# Patient Record
Sex: Female | Born: 1965 | Race: Black or African American | Hispanic: No | Marital: Single | Smoking: Current every day smoker
Health system: Southern US, Community
[De-identification: ages and names within clinical notes are randomized; demographics above are authoritative.]

## PROBLEM LIST (undated history)

## (undated) DIAGNOSIS — I10 Essential (primary) hypertension: Secondary | ICD-10-CM

## (undated) DIAGNOSIS — G629 Polyneuropathy, unspecified: Secondary | ICD-10-CM

## (undated) DIAGNOSIS — D869 Sarcoidosis, unspecified: Secondary | ICD-10-CM

## (undated) DIAGNOSIS — E785 Hyperlipidemia, unspecified: Secondary | ICD-10-CM

## (undated) DIAGNOSIS — I639 Cerebral infarction, unspecified: Secondary | ICD-10-CM

## (undated) HISTORY — PX: SKIN SURGERY: SHX2413

## (undated) HISTORY — PX: HAND SURGERY: SHX662

## (undated) HISTORY — DX: Cerebral infarction, unspecified: I63.9

## (undated) HISTORY — DX: Hyperlipidemia, unspecified: E78.5

## (undated) HISTORY — DX: Polyneuropathy, unspecified: G62.9

## (undated) HISTORY — PX: GALLBLADDER SURGERY: SHX652

---

## 1998-03-13 HISTORY — PX: BREAST SURGERY: SHX581

## 2013-05-03 ENCOUNTER — Emergency Department: Payer: Self-pay | Admitting: Emergency Medicine

## 2013-05-03 LAB — CBC WITH DIFFERENTIAL/PLATELET
BASOS PCT: 0.3 %
Basophil #: 0 10*3/uL (ref 0.0–0.1)
Eosinophil #: 0.1 10*3/uL (ref 0.0–0.7)
Eosinophil %: 0.6 %
HCT: 38.1 % (ref 35.0–47.0)
HGB: 13.1 g/dL (ref 12.0–16.0)
Lymphocyte #: 1.7 10*3/uL (ref 1.0–3.6)
Lymphocyte %: 11.8 %
MCH: 33 pg (ref 26.0–34.0)
MCHC: 34.3 g/dL (ref 32.0–36.0)
MCV: 96 fL (ref 80–100)
MONOS PCT: 7.7 %
Monocyte #: 1.1 x10 3/mm — ABNORMAL HIGH (ref 0.2–0.9)
NEUTROS PCT: 79.6 %
Neutrophil #: 11.2 10*3/uL — ABNORMAL HIGH (ref 1.4–6.5)
Platelet: 174 10*3/uL (ref 150–440)
RBC: 3.96 10*6/uL (ref 3.80–5.20)
RDW: 12.8 % (ref 11.5–14.5)
WBC: 14.1 10*3/uL — ABNORMAL HIGH (ref 3.6–11.0)

## 2013-05-03 LAB — URINALYSIS, COMPLETE
Bilirubin,UR: NEGATIVE
Blood: NEGATIVE
Glucose,UR: NEGATIVE mg/dL (ref 0–75)
Ketone: NEGATIVE
NITRITE: NEGATIVE
PH: 6 (ref 4.5–8.0)
Protein: NEGATIVE
RBC,UR: 1 /HPF (ref 0–5)
Specific Gravity: 1.012 (ref 1.003–1.030)
Squamous Epithelial: 7
WBC UR: 1 /HPF (ref 0–5)

## 2013-05-03 LAB — COMPREHENSIVE METABOLIC PANEL
ALK PHOS: 69 U/L
AST: 26 U/L (ref 15–37)
Albumin: 3.4 g/dL (ref 3.4–5.0)
Anion Gap: 4 — ABNORMAL LOW (ref 7–16)
BILIRUBIN TOTAL: 0.6 mg/dL (ref 0.2–1.0)
BUN: 13 mg/dL (ref 7–18)
CREATININE: 0.98 mg/dL (ref 0.60–1.30)
Calcium, Total: 8.8 mg/dL (ref 8.5–10.1)
Chloride: 105 mmol/L (ref 98–107)
Co2: 25 mmol/L (ref 21–32)
EGFR (Non-African Amer.): >60
GLUCOSE: 103 mg/dL — AB (ref 65–99)
Osmolality: 269 (ref 275–301)
POTASSIUM: 4 mmol/L (ref 3.5–5.1)
SGPT (ALT): 17 U/L (ref 12–78)
SODIUM: 134 mmol/L — AB (ref 136–145)
TOTAL PROTEIN: 8.3 g/dL — AB (ref 6.4–8.2)

## 2013-05-03 LAB — LIPASE, BLOOD: Lipase: 50 U/L — ABNORMAL LOW (ref 73–393)

## 2013-07-22 ENCOUNTER — Ambulatory Visit: Payer: Self-pay | Admitting: Anesthesiology

## 2013-07-24 ENCOUNTER — Ambulatory Visit: Payer: Self-pay | Admitting: Orthopedic Surgery

## 2013-07-25 LAB — PATHOLOGY REPORT

## 2013-09-30 DIAGNOSIS — J449 Chronic obstructive pulmonary disease, unspecified: Secondary | ICD-10-CM | POA: Insufficient documentation

## 2013-10-12 ENCOUNTER — Emergency Department: Payer: Self-pay | Admitting: Emergency Medicine

## 2014-07-04 NOTE — Op Note (Signed)
PATIENT NAME:  Rebecca Yang, Rebecca Yang MR#:  [id number] DATE OF BIRTH:  [birth year]  DATE OF PROCEDURE:  07/24/2013  PREOPERATIVE DIAGNOSIS: Right palmar mass.   POSTOPERATIVE DIAGNOSIS: Right palmar mass.   PROCEDURE: Excision of mass, right palm.   ANESTHESIA: General.   SURGEON: Leitha SchullerMichael J. Shawna Wearing, MD  DESCRIPTION OF PROCEDURE: The patient was brought to the operating room, and after adequate anesthesia was obtained, the right arm was prepped and draped in the usual sterile fashion, with a tourniquet applied to the upper arm. After patient identification and timeout procedures were completed, the tourniquet was raised to 250 mmHg. A curvilinear incision was made centered over the mass and the skin elevated off of it. The mass came up to the subcutaneous tissue. It separated from the surrounding tissue and appeared to have the consistency of a ganglion. It was removed without difficulty. It did seem to have a remaining stalk that went down to the flexor tendon sheath, and this was debrided, opening up the flexor tendon sheath to try to prevent recurrence. The wound was then irrigated and closed with simple interrupted 4-0 nylon skin sutures. Then, 10 mL of 0.5% Sensorcaine without epinephrine was infiltrated proximal to the incision to aid in postoperative analgesia. Sterile dressing of Xeroform, 4 x 4, Webril and Ace wrap applied.   TOURNIQUET TIME: 7 minutes at 250 mmHg.   COMPLICATIONS: None.  SPECIMEN: Mass, right palm, presumed cyst.   ____________________________ Leitha SchullerMichael J. Emiyah Spraggins, MD mjm:lb Yang: 07/24/2013 08:44:34 ET T: 07/24/2013 09:00:00 ET JOB#: [id number]  cc: Leitha SchullerMichael J. Aleksandr Pellow, MD, <Dictator> Leitha SchullerMICHAEL J Fionna Merriott MD ELECTRONICALLY SIGNED 07/24/2013 11:04

## 2015-01-05 ENCOUNTER — Emergency Department: Payer: Medicaid Other

## 2015-01-05 ENCOUNTER — Encounter: Payer: Self-pay | Admitting: Emergency Medicine

## 2015-01-05 ENCOUNTER — Emergency Department
Admission: EM | Admit: 2015-01-05 | Discharge: 2015-01-05 | Disposition: A | Discharge status: Home / Self Care | Payer: Medicaid Other | Attending: Emergency Medicine | Admitting: Emergency Medicine

## 2015-01-05 DIAGNOSIS — S0990XA Unspecified injury of head, initial encounter: Secondary | ICD-10-CM | POA: Insufficient documentation

## 2015-01-05 DIAGNOSIS — S161XXA Strain of muscle, fascia and tendon at neck level, initial encounter: Secondary | ICD-10-CM

## 2015-01-05 DIAGNOSIS — I10 Essential (primary) hypertension: Secondary | ICD-10-CM | POA: Diagnosis not present

## 2015-01-05 DIAGNOSIS — Y9389 Activity, other specified: Secondary | ICD-10-CM | POA: Diagnosis not present

## 2015-01-05 DIAGNOSIS — Z880 Allergy status to penicillin: Secondary | ICD-10-CM | POA: Insufficient documentation

## 2015-01-05 DIAGNOSIS — S0081XA Abrasion of other part of head, initial encounter: Secondary | ICD-10-CM | POA: Diagnosis not present

## 2015-01-05 DIAGNOSIS — Y998 Other external cause status: Secondary | ICD-10-CM | POA: Diagnosis not present

## 2015-01-05 DIAGNOSIS — S0083XA Contusion of other part of head, initial encounter: Secondary | ICD-10-CM

## 2015-01-05 DIAGNOSIS — Z720 Tobacco use: Secondary | ICD-10-CM | POA: Insufficient documentation

## 2015-01-05 DIAGNOSIS — S199XXA Unspecified injury of neck, initial encounter: Secondary | ICD-10-CM | POA: Diagnosis present

## 2015-01-05 DIAGNOSIS — Y92410 Unspecified street and highway as the place of occurrence of the external cause: Secondary | ICD-10-CM | POA: Insufficient documentation

## 2015-01-05 DIAGNOSIS — S299XXA Unspecified injury of thorax, initial encounter: Secondary | ICD-10-CM | POA: Diagnosis not present

## 2015-01-05 HISTORY — DX: Essential (primary) hypertension: I10

## 2015-01-05 MED ORDER — IBUPROFEN 800 MG PO TABS: 800.0000 mg | ORAL_TABLET | Freq: Three times a day (TID) | ORAL | Status: DC | PRN

## 2015-01-05 MED ORDER — DIAZEPAM 5 MG PO TABS: 5.0000 mg | ORAL_TABLET | Freq: Three times a day (TID) | ORAL | Status: DC | PRN

## 2015-01-05 MED ORDER — OXYCODONE-ACETAMINOPHEN 5-325 MG PO TABS
2.0000 | ORAL_TABLET | Freq: Once | ORAL | Status: AC
  Administered 2015-01-05: 2 via ORAL
  Filled 2015-01-05: qty 2
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
## 2015-01-05 NOTE — Discharge Instructions (Signed)
Cervical Strain and Sprain With Rehab °Cervical strain and sprain are injuries that commonly occur with "whiplash" injuries. Whiplash occurs when the neck is forcefully whipped backward or forward, such as during a motor vehicle accident or during contact sports. The muscles, ligaments, tendons, discs, and nerves of the neck are susceptible to injury when this occurs. °RISK FACTORS °Risk of having a whiplash injury increases if: °· Osteoarthritis of the spine. °· Situations that make head or neck accidents or trauma more likely. °· High-risk sports (football, rugby, wrestling, hockey, auto racing, gymnastics, diving, contact karate, or boxing). °· Poor strength and flexibility of the neck. °· Previous neck injury. °· Poor tackling technique. °· Improperly fitted or padded equipment. °SYMPTOMS  °· Pain or stiffness in the front or back of neck or both. °· Symptoms may present immediately or up to 24 hours after injury. °· Dizziness, headache, nausea, and vomiting. °· Muscle spasm with soreness and stiffness in the neck. °· Tenderness and swelling at the injury site. °PREVENTION °· Learn and use proper technique (avoid tackling with the head, spearing, and head-butting; use proper falling techniques to avoid landing on the head). °· Warm up and stretch properly before activity. °· Maintain physical fitness: °· Strength, flexibility, and endurance. °· Cardiovascular fitness. °· Wear properly fitted and padded protective equipment, such as padded soft collars, for participation in contact sports. °PROGNOSIS  °Recovery from cervical strain and sprain injuries is dependent on the extent of the injury. These injuries are usually curable in 1 week to 3 months with appropriate treatment.  °RELATED COMPLICATIONS  °· Temporary numbness and weakness may occur if the nerve roots are damaged, and this may persist until the nerve has completely healed. °· Chronic pain due to frequent recurrence of symptoms. °· Prolonged healing,  especially if activity is resumed too soon (before complete recovery). °TREATMENT  °Treatment initially involves the use of ice and medication to help reduce pain and inflammation. It is also important to perform strengthening and stretching exercises and modify activities that worsen symptoms so the injury does not get worse. These exercises may be performed at home or with a therapist. For patients who experience severe symptoms, a soft, padded collar may be recommended to be worn around the neck.  °Improving your posture may help reduce symptoms. Posture improvement includes pulling your chin and abdomen in while sitting or standing. If you are sitting, sit in a firm chair with your buttocks against the back of the chair. While sleeping, try replacing your pillow with a small towel rolled to 2 inches in diameter, or use a cervical pillow or soft cervical collar. Poor sleeping positions delay healing.  °For patients with nerve root damage, which causes numbness or weakness, the use of a cervical traction apparatus may be recommended. Surgery is rarely necessary for these injuries. However, cervical strain and sprains that are present at birth (congenital) may require surgery. °MEDICATION  °· If pain medication is necessary, nonsteroidal anti-inflammatory medications, such as aspirin and ibuprofen, or other minor pain relievers, such as acetaminophen, are often recommended. °· Do not take pain medication for 7 days before surgery. °· Prescription pain relievers may be given if deemed necessary by your caregiver. Use only as directed and only as much as you need. °HEAT AND COLD:  °· Cold treatment (icing) relieves pain and reduces inflammation. Cold treatment should be applied for 10 to 15 minutes every 2 to 3 hours for inflammation and pain and immediately after any activity that aggravates your   symptoms. Use ice packs or an ice massage. °· Heat treatment may be used prior to performing the stretching and  strengthening activities prescribed by your caregiver, physical therapist, or athletic trainer. Use a heat pack or a warm soak. °SEEK MEDICAL CARE IF:  °· Symptoms get worse or do not improve in 2 weeks despite treatment. °· New, unexplained symptoms develop (drugs used in treatment may produce side effects). °EXERCISES °RANGE OF MOTION (ROM) AND STRETCHING EXERCISES - Cervical Strain and Sprain °These exercises may help you when beginning to rehabilitate your injury. In order to successfully resolve your symptoms, you must improve your posture. These exercises are designed to help reduce the forward-head and rounded-shoulder posture which contributes to this condition. Your symptoms may resolve with or without further involvement from your physician, physical therapist or athletic trainer. While completing these exercises, remember:  °· Restoring tissue flexibility helps normal motion to return to the joints. This allows healthier, less painful movement and activity. °· An effective stretch should be held for at least 20 seconds, although you may need to begin with shorter hold times for comfort. °· A stretch should never be painful. You should only feel a gentle lengthening or release in the stretched tissue. °STRETCH- Axial Extensors °· Lie on your back on the floor. You may bend your knees for comfort. Place a rolled-up hand towel or dish towel, about 2 inches in diameter, under the part of your head that makes contact with the floor. °· Gently tuck your chin, as if trying to make a "double chin," until you feel a gentle stretch at the base of your head. °· Hold __________ seconds. °Repeat __________ times. Complete this exercise __________ times per day.  °STRETCH - Axial Extension  °· Stand or sit on a firm surface. Assume a good posture: chest up, shoulders drawn back, abdominal muscles slightly tense, knees unlocked (if standing) and feet hip width apart. °· Slowly retract your chin so your head slides back  and your chin slightly lowers. Continue to look straight ahead. °· You should feel a gentle stretch in the back of your head. Be certain not to feel an aggressive stretch since this can cause headaches later. °· Hold for __________ seconds. °Repeat __________ times. Complete this exercise __________ times per day. °STRETCH - Cervical Side Bend  °· Stand or sit on a firm surface. Assume a good posture: chest up, shoulders drawn back, abdominal muscles slightly tense, knees unlocked (if standing) and feet hip width apart. °· Without letting your nose or shoulders move, slowly tip your right / left ear to your shoulder until your feel a gentle stretch in the muscles on the opposite side of your neck. °· Hold __________ seconds. °Repeat __________ times. Complete this exercise __________ times per day. °STRETCH - Cervical Rotators  °· Stand or sit on a firm surface. Assume a good posture: chest up, shoulders drawn back, abdominal muscles slightly tense, knees unlocked (if standing) and feet hip width apart. °· Keeping your eyes level with the ground, slowly turn your head until you feel a gentle stretch along the back and opposite side of your neck. °· Hold __________ seconds. °Repeat __________ times. Complete this exercise __________ times per day. °RANGE OF MOTION - Neck Circles  °· Stand or sit on a firm surface. Assume a good posture: chest up, shoulders drawn back, abdominal muscles slightly tense, knees unlocked (if standing) and feet hip width apart. °· Gently roll your head down and around from the back   of one shoulder to the back of the other. The motion should never be forced or painful. °· Repeat the motion 10-20 times, or until you feel the neck muscles relax and loosen. °Repeat __________ times. Complete the exercise __________ times per day. °STRENGTHENING EXERCISES - Cervical Strain and Sprain °These exercises may help you when beginning to rehabilitate your injury. They may resolve your symptoms with or  without further involvement from your physician, physical therapist, or athletic trainer. While completing these exercises, remember:  °· Muscles can gain both the endurance and the strength needed for everyday activities through controlled exercises. °· Complete these exercises as instructed by your physician, physical therapist, or athletic trainer. Progress the resistance and repetitions only as guided. °· You may experience muscle soreness or fatigue, but the pain or discomfort you are trying to eliminate should never worsen during these exercises. If this pain does worsen, stop and make certain you are following the directions exactly. If the pain is still present after adjustments, discontinue the exercise until you can discuss the trouble with your clinician. °STRENGTH - Cervical Flexors, Isometric °· Face a wall, standing about 6 inches away. Place a small pillow, a ball about 6-8 inches in diameter, or a folded towel between your forehead and the wall. °· Slightly tuck your chin and gently push your forehead into the soft object. Push only with mild to moderate intensity, building up tension gradually. Keep your jaw and forehead relaxed. °· Hold 10 to 20 seconds. Keep your breathing relaxed. °· Release the tension slowly. Relax your neck muscles completely before you start the next repetition. °Repeat __________ times. Complete this exercise __________ times per day. °STRENGTH- Cervical Lateral Flexors, Isometric  °· Stand about 6 inches away from a wall. Place a small pillow, a ball about 6-8 inches in diameter, or a folded towel between the side of your head and the wall. °· Slightly tuck your chin and gently tilt your head into the soft object. Push only with mild to moderate intensity, building up tension gradually. Keep your jaw and forehead relaxed. °· Hold 10 to 20 seconds. Keep your breathing relaxed. °· Release the tension slowly. Relax your neck muscles completely before you start the next  repetition. °Repeat __________ times. Complete this exercise __________ times per day. °STRENGTH - Cervical Extensors, Isometric  °· Stand about 6 inches away from a wall. Place a small pillow, a ball about 6-8 inches in diameter, or a folded towel between the back of your head and the wall. °· Slightly tuck your chin and gently tilt your head back into the soft object. Push only with mild to moderate intensity, building up tension gradually. Keep your jaw and forehead relaxed. °· Hold 10 to 20 seconds. Keep your breathing relaxed. °· Release the tension slowly. Relax your neck muscles completely before you start the next repetition. °Repeat __________ times. Complete this exercise __________ times per day. °POSTURE AND BODY MECHANICS CONSIDERATIONS - Cervical Strain and Sprain °Keeping correct posture when sitting, standing or completing your activities will reduce the stress put on different body tissues, allowing injured tissues a chance to heal and limiting painful experiences. The following are general guidelines for improved posture. Your physician or physical therapist will provide you with any instructions specific to your needs. While reading these guidelines, remember: °· The exercises prescribed by your provider will help you have the flexibility and strength to maintain correct postures. °· The correct posture provides the optimal environment for your joints to work.   All of your joints have less wear and tear when properly supported by a spine with good posture. This means you will experience a healthier, less painful body. °· Correct posture must be practiced with all of your activities, especially prolonged sitting and standing. Correct posture is as important when doing repetitive low-stress activities (typing) as it is when doing a single heavy-load activity (lifting). °PROLONGED STANDING WHILE SLIGHTLY LEANING FORWARD °When completing a task that requires you to lean forward while standing in one  place for a long time, place either foot up on a stationary 2- to 4-inch high object to help maintain the best posture. When both feet are on the ground, the low back tends to lose its slight inward curve. If this curve flattens (or becomes too large), then the back and your other joints will experience too much stress, fatigue more quickly, and can cause pain.  °RESTING POSITIONS °Consider which positions are most painful for you when choosing a resting position. If you have pain with flexion-based activities (sitting, bending, stooping, squatting), choose a position that allows you to rest in a less flexed posture. You would want to avoid curling into a fetal position on your side. If your pain worsens with extension-based activities (prolonged standing, working overhead), avoid resting in an extended position such as sleeping on your stomach. Most people will find more comfort when they rest with their spine in a more neutral position, neither too rounded nor too arched. Lying on a non-sagging bed on your side with a pillow between your knees, or on your back with a pillow under your knees will often provide some relief. Keep in mind, being in any one position for a prolonged period of time, no matter how correct your posture, can still lead to stiffness. °WALKING °Walk with an upright posture. Your ears, shoulders, and hips should all line up. °OFFICE WORK °When working at a desk, create an environment that supports good, upright posture. Without extra support, muscles fatigue and lead to excessive strain on joints and other tissues. °CHAIR: °· A chair should be able to slide under your desk when your back makes contact with the back of the chair. This allows you to work closely. °· The chair's height should allow your eyes to be level with the upper part of your monitor and your hands to be slightly lower than your elbows. °· Body position: °¨ Your feet should make contact with the floor. If this is not  possible, use a foot rest. °¨ Keep your ears over your shoulders. This will reduce stress on your neck and low back. °  °This information is not intended to replace advice given to you by your health care provider. Make sure you discuss any questions you have with your health care provider. °  °Document Released: 02/27/2005 Document Revised: 03/20/2014 Document Reviewed: 06/11/2008 °Elsevier Interactive Patient Education ©2016 [employer] ° °Motor Vehicle Collision °After a car crash (motor vehicle collision), it is normal to have bruises and sore muscles. The first 24 hours usually feel the worst. After that, you will likely start to feel better each day. °HOME CARE °· Put ice on the injured area. °¨ Put ice in a plastic bag. °¨ Place a towel between your skin and the bag. °¨ Leave the ice on for 15-20 minutes, 03-04 times a day. °· Drink enough fluids to keep your pee (urine) clear or pale yellow. °· Do not drink alcohol. °· Take a warm shower or bath 1 or   2 times a day. This helps your sore muscles. °· Return to activities as told by your doctor. Be careful when lifting. Lifting can make neck or back pain worse. °· Only take medicine as told by your doctor. Do not use aspirin. °GET HELP RIGHT AWAY IF:  °· Your arms or legs tingle, feel weak, or lose feeling (numbness). °· You have headaches that do not get better with medicine. °· You have neck pain, especially in the middle of the back of your neck. °· You cannot control when you pee (urinate) or poop (bowel movement). °· Pain is getting worse in any part of your body. °· You are short of breath, dizzy, or pass out (faint). °· You have chest pain. °· You feel sick to your stomach (nauseous), throw up (vomit), or sweat. °· You have belly (abdominal) pain that gets worse. °· There is blood in your pee, poop, or throw up. °· You have pain in your shoulder (shoulder strap areas). °· Your problems are getting worse. °MAKE SURE YOU:  °· Understand these  instructions. °· Will watch your condition. °· Will get help right away if you are not doing well or get worse. °  °This information is not intended to replace advice given to you by your health care provider. Make sure you discuss any questions you have with your health care provider. °  °Document Released: 08/16/2007 Document Revised: 05/22/2011 Document Reviewed: 07/27/2010 °Elsevier Interactive Patient Education ©2016 [employer] ° °
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
## 2017-01-22 ENCOUNTER — Emergency Department
Admission: EM | Admit: 2017-01-22 | Discharge: 2017-01-22 | Disposition: A | Discharge status: Home / Self Care | Payer: Self-pay | Attending: Emergency Medicine | Admitting: Emergency Medicine

## 2017-01-22 ENCOUNTER — Encounter: Payer: Self-pay | Admitting: Emergency Medicine

## 2017-01-22 ENCOUNTER — Emergency Department: Payer: Self-pay

## 2017-01-22 DIAGNOSIS — F17200 Nicotine dependence, unspecified, uncomplicated: Secondary | ICD-10-CM | POA: Insufficient documentation

## 2017-01-22 DIAGNOSIS — B9789 Other viral agents as the cause of diseases classified elsewhere: Secondary | ICD-10-CM

## 2017-01-22 DIAGNOSIS — J069 Acute upper respiratory infection, unspecified: Secondary | ICD-10-CM | POA: Insufficient documentation

## 2017-01-22 DIAGNOSIS — I10 Essential (primary) hypertension: Secondary | ICD-10-CM | POA: Insufficient documentation

## 2017-01-22 HISTORY — DX: Sarcoidosis, unspecified: D86.9

## 2017-01-22 MED ORDER — IPRATROPIUM-ALBUTEROL 0.5-2.5 (3) MG/3ML IN SOLN
RESPIRATORY_TRACT | Status: AC
  Filled 2017-01-22: qty 3

## 2017-01-22 MED ORDER — GUAIFENESIN-CODEINE 100-10 MG/5ML PO SOLN: 5.0000 mL | ORAL | 0 refills | Status: DC | PRN

## 2017-01-22 MED ORDER — IPRATROPIUM-ALBUTEROL 0.5-2.5 (3) MG/3ML IN SOLN
3.0000 mL | Freq: Once | RESPIRATORY_TRACT | Status: AC
  Administered 2017-01-22: 3 mL via RESPIRATORY_TRACT
  Filled 2017-01-22: qty 3

## 2017-01-22 NOTE — Discharge Instructions (Signed)
Follow-[facility] primary care provider or [facility] if any continued problems. Increase fluids. Tylenol or ibuprofen as needed for body aches or fever. You have a prescription for Robitussin with codeine. Take this medication only as directed. Do not drive or operate machinery while taking this medication due to drowsiness.

## 2017-01-22 NOTE — ED Provider Notes (Signed)
[facility] Emergency Department Provider Note   ____________________________________________   First MD Initiated Contact with Patient 01/22/17 1626     (approximate)  I have reviewed the triage vital signs and the nursing notes.   HISTORY  Chief Complaint Cough; Generalized Body Aches; and Sore Throat  HPI Rebecca Yang is a 51 y.o. female complaint sore throat for 5-6 days with a productive cough. Patient complains of congestion and body aches. Patient has hoarseness when she speaks. Patient continues to eat and drink as normal. She has been taking some over-the-counter medication without any relief.patient states that she continues to cough at night preventing her from sleeping. She rates her pain as 7 out of 10.   Past Medical History:  Diagnosis Date  . Hypertension   . Sarcoidosis     There are no active problems to display for this patient.   Past Surgical History:  Procedure Laterality Date  . BREAST SURGERY  2000   lumpectomy  . HAND SURGERY    . SKIN SURGERY      Prior to Admission medications   Medication Sig Start Date End Date Taking? Authorizing Provider  guaiFENesin-codeine 100-10 MG/5ML syrup Take 5 mLs every 4 (four) hours as needed by mouth for cough. 01/22/17   Tommi RumpsSummers, Rhonda L, PA-C    Allergies Penicillins  No family history on file.  Social History Social History   Tobacco Use  . Smoking status: Current Every Day Smoker    Packs/day: 0.20  . Smokeless tobacco: Never Used  Substance Use Topics  . Alcohol use: Yes    Comment: occas  . Drug use: Not on file    Review of Systems Constitutional: No fever/chills Eyes: No visual changes. ENT: positive sore throat. Cardiovascular: Denies chest pain. Respiratory: Denies shortness of breath. Positive productive cough. Gastrointestinal:   No nausea, no vomiting.   Musculoskeletal: Negative for back pain. Skin: Negative for rash. Neurological: Negative for  headaches, focal weakness or numbness. ____________________________________________   PHYSICAL EXAM:  VITAL SIGNS: ED Triage Vitals  Enc Vitals Group     BP 01/22/17 1543 122/87     Pulse Rate 01/22/17 1543 94     Resp 01/22/17 1543 18     Temp 01/22/17 1543 98.4 F (36.9 C)     Temp Source 01/22/17 1543 Oral     SpO2 01/22/17 1543 98 %     Weight 01/22/17 1544 127 lb (57.6 kg)     Height 01/22/17 1544 5\' 4"  (1.626 m)     Head Circumference --      Peak Flow --      Pain Score 01/22/17 1543 7     Pain Loc --      Pain Edu? --      Excl. in GC? --    Constitutional: Alert and oriented. Well appearing and in no acute distress. Eyes: Conjunctivae are normal.  Head: Atraumatic. Nose:mild congestion/rhinnorhea.  EACs are clear. TMs are dull bilaterally. Mouth/Throat: Mucous membranes are moist.  Oropharynx non-erythematous. Moderate posterior drainage seen. Neck: No stridor.   Hematological/Lymphatic/Immunilogical: No cervical lymphadenopathy. Cardiovascular: Normal rate, regular rhythm. Grossly normal heart sounds.  Good peripheral circulation. Respiratory: Normal respiratory effort.  No retractions. Lungs no wheezes no rales are noted. Patient has congested cough. Musculoskeletal: moves upper and lower extremities without any difficulty. Normal gait was noted. Neurologic:  Normal speech and language. No gross focal neurologic deficits are appreciated.  Skin:  Skin is warm, dry and intact.  No rash noted. Psychiatric: Mood and affect are normal. Speech and behavior are normal.  ____________________________________________   LABS (all labs ordered are listed, but only abnormal results are displayed)  Labs Reviewed - No data to display RADIOLOGY  Dg Chest 2 View  Result Date: 01/22/2017 CLINICAL DATA:  Patient reports having a sore throat since Wednesday and productive cough/congestion with yellow phlegm with body aches. EXAM: CHEST  2 VIEW COMPARISON:  None. FINDINGS:  Cardiac silhouette is normal in size and configuration. No mediastinal or hilar masses. No convincing adenopathy. Pulmonary anastomosis staples are noted in the left lung base. There are linear and coarse reticular opacities in the lungs most evident in the upper lobes, consistent with scarring. There is no lung consolidation to suggest pneumonia. There is no evidence of pulmonary edema. No pleural effusion or pneumothorax. Skeletal structures are intact. IMPRESSION: 1. No acute cardiopulmonary disease. 2. Changes from prior left lower lung surgery. Bilateral, predominantly upper lobe, scarring. Electronically Signed   By: Amie Portlandavid  Ormond M.D.   On: 01/22/2017 18:25    ____________________________________________   PROCEDURES  Procedure(s) performed: None  Procedures  Critical Care performed: No  ____________________________________________   INITIAL IMPRESSION / ASSESSMENT AND PLAN / ED COURSE Patient was made aware that her chest x-ray did not show any signs of pneumonia. She was given information about viral upper respiratory infections. She is to increase fluids. Take Tylenol or ibuprofen as needed for body aches. She is also given a prescription for Robitussin with codeine to take as needed for cough. She is aware she cannot drive while taking this medication.  Patient was also given a nebulizer treatment while in the department which she states made little improvement of her cough.  ____________________________________________   FINAL CLINICAL IMPRESSION(S) / ED DIAGNOSES  Final diagnoses:  Viral URI with cough     ED Discharge Orders        Ordered    guaiFENesin-codeine 100-10 MG/5ML syrup  Every 4 hours PRN     01/22/17 1839       Note:  This document was prepared using Dragon voice recognition software and may include unintentional dictation errors.    Tommi RumpsSummers, Rhonda L, PA-C 01/22/17 1844    Tommi RumpsSummers, Rhonda L, PA-C 01/22/17 1950    Tommi RumpsSummers, Rhonda L,  PA-C 01/22/17 2055    Nita SickleVeronese, Pettus, MD 01/25/17 [id number]

## 2017-01-22 NOTE — ED Triage Notes (Signed)
Patient presents to the ED with sore throat since Wednesday and productive cough/congestion with yellow phlegm with body aches.  Patient's voice is hoarse but patient is maintaining secretions and speaking in full sentences.

## 2017-01-22 NOTE — ED Notes (Addendum)
Pt reports a cough.  Coughing up yellow phlegm.  No fever . cig smoker.  No chest pain.  Hx sarcoids.   Sx since last week.    Pt alert.

## 2020-07-08 ENCOUNTER — Inpatient Hospital Stay
Admission: EM | Admit: 2020-07-08 | Discharge: 2020-07-11 | DRG: 064 | Disposition: A | Discharge status: Home / Self Care | Payer: Self-pay | Attending: Internal Medicine | Admitting: Internal Medicine

## 2020-07-08 ENCOUNTER — Emergency Department: Payer: Self-pay

## 2020-07-08 DIAGNOSIS — I10 Essential (primary) hypertension: Secondary | ICD-10-CM | POA: Diagnosis present

## 2020-07-08 DIAGNOSIS — Z20822 Contact with and (suspected) exposure to covid-19: Secondary | ICD-10-CM | POA: Diagnosis present

## 2020-07-08 DIAGNOSIS — G911 Obstructive hydrocephalus: Secondary | ICD-10-CM | POA: Diagnosis present

## 2020-07-08 DIAGNOSIS — G8191 Hemiplegia, unspecified affecting right dominant side: Secondary | ICD-10-CM | POA: Diagnosis present

## 2020-07-08 DIAGNOSIS — G939 Disorder of brain, unspecified: Secondary | ICD-10-CM

## 2020-07-08 DIAGNOSIS — I619 Nontraumatic intracerebral hemorrhage, unspecified: Secondary | ICD-10-CM | POA: Diagnosis present

## 2020-07-08 DIAGNOSIS — Z880 Allergy status to penicillin: Secondary | ICD-10-CM

## 2020-07-08 DIAGNOSIS — I618 Other nontraumatic intracerebral hemorrhage: Principal | ICD-10-CM | POA: Diagnosis present

## 2020-07-08 DIAGNOSIS — F17210 Nicotine dependence, cigarettes, uncomplicated: Secondary | ICD-10-CM | POA: Diagnosis present

## 2020-07-08 DIAGNOSIS — R29701 NIHSS score 1: Secondary | ICD-10-CM | POA: Diagnosis present

## 2020-07-08 DIAGNOSIS — R900 Intracranial space-occupying lesion found on diagnostic imaging of central nervous system: Secondary | ICD-10-CM | POA: Diagnosis present

## 2020-07-08 DIAGNOSIS — E785 Hyperlipidemia, unspecified: Secondary | ICD-10-CM | POA: Diagnosis present

## 2020-07-08 DIAGNOSIS — G935 Compression of brain: Secondary | ICD-10-CM | POA: Diagnosis present

## 2020-07-08 DIAGNOSIS — D869 Sarcoidosis, unspecified: Secondary | ICD-10-CM | POA: Diagnosis present

## 2020-07-08 DIAGNOSIS — G9389 Other specified disorders of brain: Secondary | ICD-10-CM | POA: Diagnosis present

## 2020-07-08 LAB — COMPREHENSIVE METABOLIC PANEL
ALT: 12 U/L (ref 0–44)
AST: 19 U/L (ref 15–41)
Albumin: 4.2 g/dL (ref 3.5–5.0)
Alkaline Phosphatase: 67 U/L (ref 38–126)
Anion gap: 10 (ref 5–15)
BUN: 20 mg/dL (ref 6–20)
CO2: 26 mmol/L (ref 22–32)
Calcium: 9.4 mg/dL (ref 8.9–10.3)
Chloride: 101 mmol/L (ref 98–111)
Creatinine, Ser: 1.14 mg/dL — ABNORMAL HIGH (ref 0.44–1.00)
GFR, Estimated: 56 mL/min — ABNORMAL LOW (ref >60)
Glucose, Bld: 136 mg/dL — ABNORMAL HIGH (ref 70–99)
Potassium: 3.1 mmol/L — ABNORMAL LOW (ref 3.5–5.1)
Sodium: 137 mmol/L (ref 135–145)
Total Bilirubin: 0.8 mg/dL (ref 0.3–1.2)
Total Protein: 8.3 g/dL — ABNORMAL HIGH (ref 6.5–8.1)

## 2020-07-08 LAB — URINALYSIS, COMPLETE (UACMP) WITH MICROSCOPIC
Bacteria, UA: NONE SEEN
Bilirubin Urine: NEGATIVE
Glucose, UA: NEGATIVE mg/dL
Hgb urine dipstick: NEGATIVE
Ketones, ur: NEGATIVE mg/dL
Leukocytes,Ua: NEGATIVE
Nitrite: NEGATIVE
Protein, ur: NEGATIVE mg/dL
Specific Gravity, Urine: 1.014 (ref 1.005–1.030)
Squamous Epithelial / LPF: NONE SEEN (ref 0–5)
pH: 5 (ref 5.0–8.0)

## 2020-07-08 LAB — CBC
HCT: 39.1 % (ref 36.0–46.0)
Hemoglobin: 13.6 g/dL (ref 12.0–15.0)
MCH: 33.7 pg (ref 26.0–34.0)
MCHC: 34.8 g/dL (ref 30.0–36.0)
MCV: 96.8 fL (ref 80.0–100.0)
Platelets: 195 10*3/uL (ref 150–400)
RBC: 4.04 MIL/uL (ref 3.87–5.11)
RDW: 12.2 % (ref 11.5–15.5)
WBC: 8.1 10*3/uL (ref 4.0–10.5)
nRBC: 0 % (ref 0.0–0.2)

## 2020-07-08 MED ORDER — KETOROLAC TROMETHAMINE 30 MG/ML IJ SOLN
30.0000 mg | Freq: Once | INTRAMUSCULAR | Status: AC
  Administered 2020-07-08: 30 mg via INTRAVENOUS
  Filled 2020-07-08: qty 1

## 2020-07-08 MED ORDER — GADOBUTROL 1 MMOL/ML IV SOLN
6.0000 mL | Freq: Once | INTRAVENOUS | Status: AC | PRN
  Administered 2020-07-08: 6 mL via INTRAVENOUS

## 2020-07-08 MED ORDER — DIPHENHYDRAMINE HCL 50 MG/ML IJ SOLN
50.0000 mg | Freq: Once | INTRAMUSCULAR | Status: AC
  Administered 2020-07-08: 50 mg via INTRAVENOUS
  Filled 2020-07-08: qty 1

## 2020-07-08 MED ORDER — SODIUM CHLORIDE 0.9 % IV BOLUS
1000.0000 mL | Freq: Once | INTRAVENOUS | Status: AC
  Administered 2020-07-08: 1000 mL via INTRAVENOUS

## 2020-07-08 MED ORDER — METOCLOPRAMIDE HCL 5 MG/ML IJ SOLN
10.0000 mg | Freq: Once | INTRAMUSCULAR | Status: AC
  Administered 2020-07-08: 10 mg via INTRAVENOUS
  Filled 2020-07-08: qty 2

## 2020-07-08 NOTE — H&P (Signed)
History and Physical   [facility] [place] [facility] Admission History and Physical [employer], D.O.    Patient Name: Rebecca Yang MR#: [id number] Date of Birth: May 06, 1965 Date of Admission: 07/08/2020  Referring MD/NP/PA: Dr. Lenard Lance Primary Care Physician: Patient, No Pcp Per (Inactive)  Chief Complaint:  Chief Complaint  Patient presents with  . Headache    Since Sunday, nausea, no vomiting     HPI: Rebecca Yang is a 55 y.o. female with a known history of HTN, sarcoidosis presents to the emergency department for evaluation of headache.  Patient was in a usual state of health until four days ago when she developed intractable headache, hasn't had one in over 20 years.  Symptoms are associated with  RUE weakness associated with phono and photophobia, nausea.  States that her speech and vision are unaffected but she didn't feel like talking due to severe headache.  She thought her HA was related to high blood pressure because she has had a poor diet recently but hasn't checked her BP at  Home.       Patient denies fevers/chills, weakness, dizziness, chest pain, shortness of breath, N/V/C/D, abdominal pain, dysuria/frequency, changes in mental status.    Otherwise there has been no change in status. Patient has been taking medication as prescribed and there has been no recent change in medication or diet.  No recent antibiotics.  There has been no recent illness, hospitalizations, travel or sick contacts.    EMS/ED Course: Patient received Medrol, Toradol, Reglan and normal saline. Medical admission has been requested for further management of intracranial mass versus hemorrhagic infarct.  Emergency department physician discussed the case with neurosurgery who requested inpatient admission for neurology consult.  Surgical intervention required at this time will likely require repeat MRI in 6 weeks for follow-up.  Review of Systems:  CONSTITUTIONAL: No  fever/chills, fatigue, weakness, weight gain/loss, EYES: No blurry or double vision. ENT: No tinnitus, postnasal drip, redness or soreness of the oropharynx. RESPIRATORY: No cough, dyspnea, wheeze.  No hemoptysis.  CARDIOVASCULAR: No chest pain, palpitations, syncope, orthopnea. No lower extremity edema.  GASTROINTESTINAL: Positive nausea, negative vomiting, abdominal pain, diarrhea, constipation.  No hematemesis, melena or hematochezia. GENITOURINARY: No dysuria, frequency, hematuria. ENDOCRINE: No polyuria or nocturia. No heat or cold intolerance. HEMATOLOGY: No anemia, bruising, bleeding. INTEGUMENTARY: No rashes, ulcers, lesions. MUSCULOSKELETAL: No arthritis, gout, dyspnea. NEUROLOGIC: Positive headache. RUE weakness No numbness, tingling, ataxia, seizure-type activity, PSYCHIATRIC: No anxiety, depression, insomnia.   Past Medical History:  Diagnosis Date  . Hypertension   . Sarcoidosis     Past Surgical History:  Procedure Laterality Date  . BREAST SURGERY  2000   lumpectomy  . HAND SURGERY    . SKIN SURGERY       reports that she has been smoking. She has been smoking about 0.20 packs per day. She has never used smokeless tobacco. She reports current alcohol use. No history on file for drug use.  Allergies  Allergen Reactions  . Penicillins Hives    History reviewed. No pertinent family history.  Prior to Admission medications   Medication Sig Start Date End Date Taking? Authorizing Provider  guaiFENesin-codeine 100-10 MG/5ML syrup Take 5 mLs every 4 (four) hours as needed by mouth for cough. 01/22/17   Tommi Rumps, PA-C    Physical Exam: Vitals:   07/08/20 1647 07/08/20 1753 07/08/20 1826 07/08/20 2145  BP: (!) 144/74 139/79 140/80 (!) 141/100  Pulse: 83 85 86 74  Resp: 18  17 18 16   Temp:      TempSrc:      SpO2: 96% 96% 98% 96%  Weight:      Height:        GENERAL: 55 y.o.-year-old black female patient, well-developed, well-nourished lying in the  bed in no acute distress.  Pleasant and cooperative.   HEENT: Head atraumatic, normocephalic. Pupils equal. Mucus membranes moist. NECK: Supple. No JVD. CHEST: Normal breath sounds bilaterally. No wheezing, rales, rhonchi or crackles. No use of accessory muscles of respiration.  No reproducible chest wall tenderness.  CARDIOVASCULAR: S1, S2 normal. No murmurs, rubs, or gallops. Cap refill <2 seconds. Pulses intact distally.  ABDOMEN: Soft, nondistended, nontender. No rebound, guarding, rigidity. Normoactive bowel sounds present in all four quadrants.  EXTREMITIES: No pedal edema, cyanosis, or clubbing. No calf tenderness or Homan's sign.  NEUROLOGIC: The patient is alert and oriented x 3. Cranial nerves II through XII are grossly intact with no focal sensorimotor deficit. PSYCHIATRIC:  Normal affect, mood, thought content. SKIN: Warm, dry, and intact without obvious rash, lesion, or ulcer.    Labs on Admission:  CBC: Recent Labs  Lab 07/08/20 1555  WBC 8.1  HGB 13.6  HCT 39.1  MCV 96.8  PLT 195   Basic Metabolic Panel: Recent Labs  Lab 07/08/20 1555  NA 137  K 3.1*  CL 101  CO2 26  GLUCOSE 136*  BUN 20  CREATININE 1.14*  CALCIUM 9.4   GFR: Estimated Creatinine Clearance: 49 mL/min (A) (by C-G formula based on SCr of 1.14 mg/dL (H)). Liver Function Tests: Recent Labs  Lab 07/08/20 1555  AST 19  ALT 12  ALKPHOS 67  BILITOT 0.8  PROT 8.3*  ALBUMIN 4.2   No results for input(s): LIPASE, AMYLASE in the last 168 hours. No results for input(s): AMMONIA in the last 168 hours. Coagulation Profile: No results for input(s): INR, PROTIME in the last 168 hours. Cardiac Enzymes: No results for input(s): CKTOTAL, CKMB, CKMBINDEX, TROPONINI in the last 168 hours. BNP (last 3 results) No results for input(s): PROBNP in the last 8760 hours. HbA1C: No results for input(s): HGBA1C in the last 72 hours. CBG: No results for input(s): GLUCAP in the last 168 hours. Lipid  Profile: No results for input(s): CHOL, HDL, LDLCALC, TRIG, CHOLHDL, LDLDIRECT in the last 72 hours. Thyroid Function Tests: No results for input(s): TSH, T4TOTAL, FREET4, T3FREE, THYROIDAB in the last 72 hours. Anemia Panel: No results for input(s): VITAMINB12, FOLATE, FERRITIN, TIBC, IRON, RETICCTPCT in the last 72 hours. Urine analysis:    Component Value Date/Time   COLORURINE YELLOW (A) 07/08/2020 2203   APPEARANCEUR CLEAR (A) 07/08/2020 2203   APPEARANCEUR Hazy 05/03/2013 1006   LABSPEC 1.014 07/08/2020 2203   LABSPEC 1.012 05/03/2013 1006   PHURINE 5.0 07/08/2020 2203   GLUCOSEU NEGATIVE 07/08/2020 2203   GLUCOSEU Negative 05/03/2013 1006   HGBUR NEGATIVE 07/08/2020 2203   BILIRUBINUR NEGATIVE 07/08/2020 2203   BILIRUBINUR Negative 05/03/2013 1006   KETONESUR NEGATIVE 07/08/2020 2203   PROTEINUR NEGATIVE 07/08/2020 2203   NITRITE NEGATIVE 07/08/2020 2203   LEUKOCYTESUR NEGATIVE 07/08/2020 2203   LEUKOCYTESUR Trace 05/03/2013 1006   Sepsis Labs: @LABRCNTIP (procalcitonin:4,lacticidven:4) )No results found for this or any previous visit (from the past 240 hour(s)).   Radiological Exams on Admission: CT Head Wo Contrast  Result Date: 07/08/2020 CLINICAL DATA:  Headache. EXAM: CT HEAD WITHOUT CONTRAST TECHNIQUE: Contiguous axial images were obtained from the base of the skull through the vertex without intravenous contrast.  COMPARISON:  CT head 01/05/2015 FINDINGS: Brain: Mass lesion in the right thalamus and basal ganglia which is predominately low density. There is central high density within the mass which could be calcification or hemorrhage. Mild mass-effect. Mild hydrocephalus is present. Hypodensity is seen throughout the cerebral white matter bilaterally with progression since 2016. Likely chronic microvascular ischemia. Vascular: Negative for hyperdense vessel Skull: Negative Sinuses/Orbits: Paranasal sinuses clear.  Negative orbit Other: None IMPRESSION: Mass in the right  thalamus and basal ganglia concerning for neoplasm. Central areas of high density may represent acute hemorrhage or calcification. Possible high-grade glioma. Mild obstructive hydrocephalus. MRI brain without and with contrast recommended for further evaluation. These results were called by telephone at the time of interpretation on 07/08/2020 at 4:30 pm to provider [facility] , who verbally acknowledged these results. Electronically Signed   By: Marlan Palau M.D.   On: 07/08/2020 16:31   MR Brain W and Wo Contrast  Result Date: 07/08/2020 CLINICAL DATA:  Initial evaluation for possible mass on prior CT. EXAM: MRI HEAD WITHOUT AND WITH CONTRAST TECHNIQUE: Multiplanar, multiecho pulse sequences of the brain and surrounding structures were obtained without and with intravenous contrast. CONTRAST:  71mL GADAVIST GADOBUTROL 1 MMOL/ML IV SOLN COMPARISON:  Prior CT from earlier the same day. FINDINGS: Brain: Cerebral volume within normal limits for age. Extensive patchy and confluent T2/FLAIR hyperintensity seen throughout the periventricular, deep, and subcortical white matter both cerebral hemispheres. Patchy involvement of the pons noted. Appearance most consistent with chronic microvascular ischemic disease, moderate to advanced in nature for age. Again seen is mass-like T2/FLAIR hyperintensity involving the right thalamus, corresponding with abnormality on prior head CT. Areas of central heterogeneous T2 hypointense, T1 hyperintense signal intensity with associated susceptibility artifact, consistent with hemorrhage/blood products. Extension to partially involve the adjacent right cerebral peduncle and midbrain. Following contrast administration, there is irregular somewhat ovoid enhancement within this region, measuring approximately 2.4 x 1.8 x 1.2 cm (series 19, image 89). Associated regional mass effect with localized right-to-left shift measuring up to approximately 4 mm. Finding is indeterminate, but  given overall appearance, location, and morphology, an evolving subacute hemorrhagic infarct is favored. Possible neoplasm/mass is difficult to exclude, and would be the primary differential consideration. No other evidence for acute or subacute ischemia. Gray-white matter differentiation otherwise maintained. No encephalomalacia to suggest chronic cortical infarction elsewhere within the brain. No other acute intracranial hemorrhage. Multiple additional scattered chronic micro hemorrhages noted clustered about the deep gray nuclei and brainstem, likely reflecting changes of chronic poorly controlled hypertension. This finding also suggest at the right thalamic abnormality may reflect a subacute hemorrhage. No other mass lesion or mass effect. No other abnormal enhancement. Ventricles remain within normal limits for size without significant hydrocephalus at this time. No visible intraventricular blood products. Pituitary gland and suprasellar region within normal limits. Midline structures intact. Vascular: Major intracranial vascular flow voids are maintained. Skull and upper cervical spine: Craniocervical junction within normal limits. Upper cervical spine normal. Bone marrow signal intensity within normal limits. No focal marrow replacing lesion. No scalp soft tissue abnormality. Sinuses/Orbits: Globes and orbital soft tissues demonstrate no acute finding. Paranasal sinuses are largely clear. No mastoid effusion. Inner ear structures grossly normal. Other: None. IMPRESSION: 1. 2.4 x 1.8 x 1.2 cm mass-like signal abnormality involving the right thalamus with associated regional mass effect and localized 4 mm of right-to-left shift. Finding is indeterminate, but given overall appearance, location and morphology, an evolving subacute hemorrhage/hemorrhagic infarct is favored. Possible primary CNS  neoplasm/mass is difficult to exclude, and would be the primary differential consideration. A short interval follow-up  MRI in perhaps 1 week suggested for further evaluation. 2. Multiple additional scattered chronic micro hemorrhages clustered about the deep gray nuclei and brainstem, likely related to chronic poorly controlled hypertension. Finding also suggest at the right thalamic abnormality could reflect a hemorrhage rather than neoplasm. 3. Underlying moderate to advanced chronic microvascular ischemic disease. Electronically Signed   By: Rise MuBenjamin  McClintock M.D.   On: 07/08/2020 22:19     Assessment/Plan  This is a 55 y.o. female with a history of hypertension and sarcoidosis now being admitted with:  #.  Intractable headache with MRI concerning for thalamic mass versus hemorrhagic infarct - Admit inpatient, telemetry monitoring - Neurosurgery was consulted by emergency department physician and is requesting neurology consult to be placed in a.m. as well - Neurochecks - Fall and seizure precautions - Pain control  #.  History of hypertension - BP control   Admission status: Inpatient, telemetry IV Fluids: Hep-Lock Diet/Nutrition: Heart healthy Consults called: Neurosurgery contacted by emergency department physician, please call neuro in a.m. DVT Px: SCDs and early ambulation. Code Status: Full Code  Disposition Plan: To home in 1-2 days  All the records are reviewed and case discussed with ED provider. Management plans discussed with the patient and/or family who express understanding and agree with plan of care.  Zyliah Schier D.O. on 07/08/2020 at 11:25 PM CC: Primary care physician; Patient, No Pcp Per (Inactive)   07/08/2020, 11:25 PM

## 2020-07-08 NOTE — ED Notes (Signed)
Pt ambulatory, unassisted with steady gait to toilet in room to provide UA. Pt returned to bed. Denied any dizziness/concerns while walking.

## 2020-07-08 NOTE — ED Notes (Signed)
Pt given boxed lunch with okay from Dr. Lenard Lance

## 2020-07-08 NOTE — ED Notes (Signed)
Pt in MRI at this time 

## 2020-07-08 NOTE — ED Provider Notes (Addendum)
[facility] Emergency Department Provider Note  Time seen: 3:55 PM  I have reviewed the triage vital signs and the nursing notes.   HISTORY  Chief Complaint Headache (Since Sunday, nausea, no vomiting )   HPI Rebecca Yang is a 55 y.o. female with a past medical history of hypertension, sarcoidosis, presents emergency department for headache over the past 4 days.  According to the patient for the past 4 days she has had a constant and moderate aching headache.  Describes as 8/10 pain.  Patient states photo and phonophobia as well as nausea but denies vomiting.  No fever no weakness numbness confusion or difficulty speaking/thinking.  No neck pain.  Patient does state intermittent lower back pain over the past few days but denies dysuria or hematuria.  Patient states a history of headaches in the past but states she has not had to come to the emergency department for a headache in nearly 20 years.   Past Medical History:  Diagnosis Date  . Hypertension   . Sarcoidosis     There are no problems to display for this patient.   Past Surgical History:  Procedure Laterality Date  . BREAST SURGERY  2000   lumpectomy  . HAND SURGERY    . SKIN SURGERY      Prior to Admission medications   Medication Sig Start Date End Date Taking? Authorizing Provider  guaiFENesin-codeine 100-10 MG/5ML syrup Take 5 mLs every 4 (four) hours as needed by mouth for cough. 01/22/17   Tommi Rumps, PA-C    Allergies  Allergen Reactions  . Penicillins Hives    History reviewed. No pertinent family history.  Social History Social History   Tobacco Use  . Smoking status: Current Every Day Smoker    Packs/day: 0.20  . Smokeless tobacco: Never Used  Substance Use Topics  . Alcohol use: Yes    Comment: occas    Review of Systems Constitutional: Negative for fever. Cardiovascular: Negative for chest pain. Respiratory: Negative for shortness of  breath. Gastrointestinal: Negative for abdominal pain, vomiting  Genitourinary: Negative for urinary compaints Musculoskeletal: Mild lower back pain Neurological: Positive for headache 8/10.  Negative for weakness or numbness confusion or slurred speech. All other ROS negative  ____________________________________________   PHYSICAL EXAM:  VITAL SIGNS: ED Triage Vitals  Enc Vitals Group     BP 07/08/20 1542 (!) 151/103     Pulse Rate 07/08/20 1539 87     Resp 07/08/20 1539 17     Temp 07/08/20 1539 99.7 F (37.6 C)     Temp Source 07/08/20 1539 Oral     SpO2 07/08/20 1539 96 %     Weight 07/08/20 1540 132 lb 4.4 oz (60 kg)     Height 07/08/20 1540 5\' 5"  (1.651 m)     Head Circumference --      Peak Flow --      Pain Score 07/08/20 1540 6     Pain Loc --      Pain Edu? --      Excl. in GC? --    Constitutional: Alert and oriented. Well appearing and in no distress. Eyes: Normal exam ENT      Head: Normocephalic and atraumatic.      Mouth/Throat: Mucous membranes are moist. Cardiovascular: Normal rate, regular rhythm.  Respiratory: Normal respiratory effort without tachypnea nor retractions. Breath sounds are clear  Gastrointestinal: Soft and nontender. No distention.  Musculoskeletal: Nontender with normal range of motion in  all extremities.  Neurologic:  Normal speech and language.  Neurological examination patient does have mild weakness in the right lower extremity the remainder of the exam is normal. Skin:  Skin is warm, dry and intact.  Psychiatric: Mood and affect are normal.  ____________________________________________   RADIOLOGY  CT shows concern for mass in the right thalamus with possible hemorrhage versus calcification.  With mild obstructive hydrocephalus.  IMPRESSION:  1. 2.4 x 1.8 x 1.2 cm mass-like signal abnormality involving the  right thalamus with associated regional mass effect and localized 4  mm of right-to-left shift. Finding is  indeterminate, but given  overall appearance, location and morphology, an evolving subacute  hemorrhage/hemorrhagic infarct is favored. Possible primary CNS  neoplasm/mass is difficult to exclude, and would be the primary  differential consideration. A short interval follow-up MRI in  perhaps 1 week suggested for further evaluation.  2. Multiple additional scattered chronic micro hemorrhages clustered  about the deep gray nuclei and brainstem, likely related to chronic  poorly controlled hypertension. Finding also suggest at the right  thalamic abnormality could reflect a hemorrhage rather than  neoplasm.  3. Underlying moderate to advanced chronic microvascular ischemic  disease.   ____________________________________________   INITIAL IMPRESSION / ASSESSMENT AND PLAN / ED COURSE  Pertinent labs & imaging results that were available during my care of the patient were reviewed by me and considered in my medical decision making (see chart for details).   Patient presents emergency department for headache x4 days along with photophobia phonophobia nausea.  No fever.  States that history of headaches but has been a long time since she has required an emergency department visit for headache.  We will treat patient's headache with Toradol Reglan Benadryl and IV fluids.  Given the duration of headache we will obtain CT imaging of the head to rule out mass tumor or ICH.  We will check basic labs and a urine sample as well.  Patient agreeable to plan of care.  Differential would include migraine, tension headache, less likely ICH tumor mass.  CT scan is concerning for a mass in the right thalamus possibly with hemorrhage versus calcification.  We will proceed with MR imaging.  I discussed these results with the patient and family member.  I spoke to Dr. Marcell Barlow regarding the MRI results of a mass versus hemorrhage.  Recommends admission to the hospitalist service with neurology consultation and  neurosurgery will consult as well.  Patient agreeable to plan of care.  Patient admitted to the hospital service.  NIH Stroke Scale   Interval: Baseline Time: 11:34 PM Person Administering Scale: Minna Antis  Administer stroke scale items in the order listed. Record performance in each category after each subscale exam. Do not go back and change scores. Follow directions provided for each exam technique. Scores should reflect what the patient does, not what the clinician thinks the patient can do. The clinician should record answers while administering the exam and work quickly. Except where indicated, the patient should not be coached (i.e., repeated requests to patient to make a special effort).   1a  Level of consciousness: 0=alert; keenly responsive  1b. LOC questions:  0=Performs both tasks correctly  1c. LOC commands: 0=Performs both tasks correctly  2.  Best Gaze: 0=normal  3.  Visual: 0=No visual loss  4. Facial Palsy: 0=Normal symmetric movement  5a.  Motor left arm: 0=No drift, limb holds 90 (or 45) degrees for full 10 seconds  5b.  Motor right arm:  0=No drift, limb holds 90 (or 45) degrees for full 10 seconds  6a. motor left leg: 0=No drift, limb holds 90 (or 45) degrees for full 10 seconds  6b  Motor right leg:  1=Drift, limb holds 90 (or 45) degrees but drifts down before full 10 seconds: does not hit bed  7. Limb Ataxia: 0=Absent  8.  Sensory: 0=Normal; no sensory loss  9. Best Language:  0=No aphasia, normal  10. Dysarthria: 0=Normal  11. Extinction and Inattention: 0=No abnormality  12. Distal motor function: 0=Normal   Total:   1    Rebecca Yang was evaluated in Emergency Department on 07/08/2020 for the symptoms described in the history of present illness. She was evaluated in the context of the global COVID-19 pandemic, which necessitated consideration that the patient might be at risk for infection with the SARS-CoV-2 virus that causes COVID-19.  Institutional protocols and algorithms that pertain to the evaluation of patients at risk for COVID-19 are in a state of rapid change based on information released by regulatory bodies including the CDC and federal and state organizations. These policies and algorithms were followed during the patient's care in the ED.  ____________________________________________   FINAL CLINICAL IMPRESSION(S) / ED DIAGNOSES  Headache Brain lesion   Minna Antis, MD 07/08/20 [id number]    Minna Antis, MD 07/08/20 [phone]

## 2020-07-08 NOTE — ED Triage Notes (Signed)
Since Sunday, nausea, no vomiting

## 2020-07-09 ENCOUNTER — Encounter: Payer: Self-pay | Admitting: Family Medicine

## 2020-07-09 ENCOUNTER — Other Ambulatory Visit: Payer: Self-pay

## 2020-07-09 DIAGNOSIS — I10 Essential (primary) hypertension: Secondary | ICD-10-CM | POA: Diagnosis present

## 2020-07-09 DIAGNOSIS — D869 Sarcoidosis, unspecified: Secondary | ICD-10-CM | POA: Diagnosis present

## 2020-07-09 LAB — COMPREHENSIVE METABOLIC PANEL
ALT: 10 U/L (ref 0–44)
AST: 15 U/L (ref 15–41)
Albumin: 3.5 g/dL (ref 3.5–5.0)
Alkaline Phosphatase: 58 U/L (ref 38–126)
Anion gap: 8 (ref 5–15)
BUN: 22 mg/dL — ABNORMAL HIGH (ref 6–20)
CO2: 26 mmol/L (ref 22–32)
Calcium: 8.8 mg/dL — ABNORMAL LOW (ref 8.9–10.3)
Chloride: 105 mmol/L (ref 98–111)
Creatinine, Ser: 0.99 mg/dL (ref 0.44–1.00)
GFR, Estimated: >60 mL/min (ref >60)
Glucose, Bld: 95 mg/dL (ref 70–99)
Potassium: 3.1 mmol/L — ABNORMAL LOW (ref 3.5–5.1)
Sodium: 139 mmol/L (ref 135–145)
Total Bilirubin: 0.7 mg/dL (ref 0.3–1.2)
Total Protein: 7 g/dL (ref 6.5–8.1)

## 2020-07-09 LAB — RESP PANEL BY RT-PCR (FLU A&B, COVID) ARPGX2
Influenza A by PCR: NEGATIVE
Influenza B by PCR: NEGATIVE
SARS Coronavirus 2 by RT PCR: NEGATIVE

## 2020-07-09 LAB — CBC
HCT: 33.6 % — ABNORMAL LOW (ref 36.0–46.0)
Hemoglobin: 11.9 g/dL — ABNORMAL LOW (ref 12.0–15.0)
MCH: 34.7 pg — ABNORMAL HIGH (ref 26.0–34.0)
MCHC: 35.4 g/dL (ref 30.0–36.0)
MCV: 98 fL (ref 80.0–100.0)
Platelets: 152 10*3/uL (ref 150–400)
RBC: 3.43 MIL/uL — ABNORMAL LOW (ref 3.87–5.11)
RDW: 12 % (ref 11.5–15.5)
WBC: 5.5 10*3/uL (ref 4.0–10.5)
nRBC: 0 % (ref 0.0–0.2)

## 2020-07-09 LAB — HIV ANTIBODY (ROUTINE TESTING W REFLEX): HIV Screen 4th Generation wRfx: NONREACTIVE

## 2020-07-09 MED ORDER — ACETAMINOPHEN 325 MG PO TABS: 650.0000 mg | ORAL_TABLET | Freq: Four times a day (QID) | ORAL | Status: DC | PRN

## 2020-07-09 MED ORDER — ALBUTEROL SULFATE (2.5 MG/3ML) 0.083% IN NEBU: 2.5000 mg | INHALATION_SOLUTION | Freq: Four times a day (QID) | RESPIRATORY_TRACT | Status: DC | PRN

## 2020-07-09 MED ORDER — DEXAMETHASONE SODIUM PHOSPHATE 10 MG/ML IJ SOLN
10.0000 mg | Freq: Once | INTRAMUSCULAR | Status: AC
  Administered 2020-07-09: 10 mg via INTRAVENOUS
  Filled 2020-07-09: qty 1

## 2020-07-09 MED ORDER — DEXAMETHASONE SODIUM PHOSPHATE 4 MG/ML IJ SOLN
4.0000 mg | Freq: Four times a day (QID) | INTRAMUSCULAR | Status: DC
  Administered 2020-07-09 – 2020-07-11 (×8): 4 mg via INTRAVENOUS
  Filled 2020-07-09 (×9): qty 1

## 2020-07-09 MED ORDER — MORPHINE SULFATE (PF) 2 MG/ML IV SOLN
1.0000 mg | Freq: Four times a day (QID) | INTRAVENOUS | Status: DC | PRN
  Administered 2020-07-09 (×2): 1 mg via INTRAVENOUS
  Filled 2020-07-09 (×2): qty 1

## 2020-07-09 MED ORDER — AMLODIPINE BESYLATE 5 MG PO TABS
5.0000 mg | ORAL_TABLET | Freq: Every day | ORAL | Status: DC
  Administered 2020-07-09: 5 mg via ORAL
  Filled 2020-07-09: qty 1

## 2020-07-09 MED ORDER — ACETAMINOPHEN 650 MG RE SUPP: 650.0000 mg | Freq: Four times a day (QID) | RECTAL | Status: DC | PRN

## 2020-07-09 MED ORDER — ONDANSETRON HCL 4 MG/2ML IJ SOLN: 4.0000 mg | Freq: Four times a day (QID) | INTRAMUSCULAR | Status: DC | PRN

## 2020-07-09 MED ORDER — BISACODYL 5 MG PO TBEC: 5.0000 mg | DELAYED_RELEASE_TABLET | Freq: Every day | ORAL | Status: DC | PRN

## 2020-07-09 MED ORDER — TRAZODONE HCL 50 MG PO TABS: 25.0000 mg | ORAL_TABLET | Freq: Every evening | ORAL | Status: DC | PRN

## 2020-07-09 MED ORDER — SENNOSIDES-DOCUSATE SODIUM 8.6-50 MG PO TABS: 1.0000 | ORAL_TABLET | Freq: Every evening | ORAL | Status: DC | PRN

## 2020-07-09 MED ORDER — OXYCODONE HCL 5 MG PO TABS
5.0000 mg | ORAL_TABLET | ORAL | Status: DC | PRN
Start: 2020-07-09 — End: 2020-07-11
  Administered 2020-07-09 (×2): 5 mg via ORAL
  Filled 2020-07-09 (×2): qty 1

## 2020-07-09 MED ORDER — IPRATROPIUM BROMIDE 0.02 % IN SOLN: 0.5000 mg | Freq: Four times a day (QID) | RESPIRATORY_TRACT | Status: DC | PRN

## 2020-07-09 MED ORDER — ONDANSETRON HCL 4 MG PO TABS: 4.0000 mg | ORAL_TABLET | Freq: Four times a day (QID) | ORAL | Status: DC | PRN

## 2020-07-09 MED ORDER — POTASSIUM CHLORIDE CRYS ER 20 MEQ PO TBCR
40.0000 meq | EXTENDED_RELEASE_TABLET | ORAL | Status: AC
  Administered 2020-07-09 (×2): 40 meq via ORAL
  Filled 2020-07-09 (×2): qty 2

## 2020-07-09 MED ORDER — HYDRALAZINE HCL 10 MG PO TABS
10.0000 mg | ORAL_TABLET | Freq: Four times a day (QID) | ORAL | Status: DC | PRN
  Filled 2020-07-09: qty 1

## 2020-07-09 MED ORDER — MAGNESIUM CITRATE PO SOLN
1.0000 | Freq: Once | ORAL | Status: DC | PRN
  Filled 2020-07-09: qty 296

## 2020-07-09 NOTE — Progress Notes (Signed)
PROGRESS NOTE    Rebecca HalfValerie D Yang  BMW:[id number] DOB: 10/05/1965 DOA: 07/08/2020 PCP: Patient, No Pcp Per (Inactive)    Brief Narrative:  Rebecca Yang is a 55 year old female with past medical history significant for essential hypertension, sarcoidosis who presents to the emergency department for evaluation of persistent headache.  Patient reports onset 4 days prior which is now becoming intractable headache.  No headaches for 20 years.  Patient also reports some mild right upper extremity weakness associated with phono and photophobia with nausea.  Patient reports that her speech and vision are unaffected.  She initially thought her headache was related to high blood pressure because her diet has been poor recently has not checked her BP at home.  Patient further denies fever/chills, no weakness, no dizziness, no chest pain, no shortness of breath, no nausea/vomiting/diarrhea, no abdominal pain, no dysuria/frequency, no confusion.  No recent change in home medications, no recent illness, no hospitalizations, no travel or sick contacts.  In the ED, temperature 9 9.7 F, HR 87, RR 17, BP 151/103, SPO2 96% on room air.  Sodium 137, potassium 3.1, chloride 101, CO2 26, glucose 136, BUN 20, creatinine 1.14.  AST 19, ALT 12.  Total bilirubin 0.8.  WBC 8.1, hemoglobin 13.6, platelets 195.  COVID-19 PCR negative.  Influenza A/B PCR negative.  Urinalysis unrevealing.  CT head without contrast with mass in the right thalamus and basal ganglia concerning for neoplasm, central areas of high density may represent acute hemorrhage or calcification, possible high-grade glioma.  MR brain with and without contrast with findings of 2.1 x 1.8 x 1.2 cm masslike abnormality right thalamus with associated regional mass-effect, 4 mm right to left shift concerning for evolving subacute hemorrhage/hemorrhagic infarct versus primary CNS neoplasm.  Medrol, Toradol, Reglan and normal saline.  EDP discussed with neurosurgery who  requested inpatient mission for neurology consult.  Hospitalist service consulted for further evaluation and management.   Assessment & Plan:   Principal Problem:   Intracranial mass Active Problems:   Essential hypertension   Sarcoidosis   Intractable headache secondary to right thalmic mass versus hemorrhagic infarct Patient presenting to ED with 4-day history of progressive/intractable headache.  CT/MRI findings concerning for 2.1 x 1.8 x 1.2 cm masslike abnormality right thalamus with regional mass-effect concerning for subacute hemorrhage/hemorrhagic infarct versus primary CNS neoplasm.  Patient was seen by neurosurgery, Dr. Marcell BarlowYarborough on 4/29, with recommendations of repeat MR 4-6 weeks and PT/OT evaluation.  No acute need for surgical intervention at this time and if neoplasm biopsy would probably be necessary dependent on repeat scan. --Neurology consulted --Start antihypertensives as below for concern of possible hemorrhage to control SBP less than 140 --Decadron 10 mg IV x1 followed by 4 mg IV every 6 hours --Pending PT/OT evaluation --Supportive care, Tylenol, oxycodone, morphine  Essential hypertension Not on home antihypertensive regimen.  Blood pressure elevated 140/93 this morning. --start amlodipine 5mg  PO daily --hydralazine 10mg  PO q6h prn for SBP <140 given possible hemorrhagic CVA --monitor BP closelty  History of sarcoidosis [facility], Dr. Meredeth IdeFleming; last seen per EMR 2015. --Recommend further outpatient follow-up   DVT prophylaxis: DVT prophylaxis contraindicated with concern of possible intracranial hemorrhage   Code Status: Full Code Family Communication: Family present at bedside this morning.  Disposition Plan:  Level of care: Med-Surg Status is: Inpatient  Remains inpatient appropriate because:Ongoing diagnostic testing needed not appropriate for outpatient work up, Unsafe d/c plan, IV treatments  appropriate due to intensity of  illness or inability to take PO and Inpatient level of care appropriate due to severity of illness   Dispo: The patient is from: Home              Anticipated d/c is to: Home              Patient currently is not medically stable to d/c.   Difficult to place patient No  Consultants:   Neurosurgery, Dr. Marcell Barlow  Neurology, Dr. Amada Jupiter  Procedures:   None  Antimicrobials:   None   Subjective: Patient seen and examined at bedside, resting currently.  Continues with headache localized to the right side.  Reports weakness improved.  Feels tired and wants to sleep.  Seen by neurosurgery this morning with recommendations of PT/OT evaluation, steroid trial and outpatient follow-up with MR 4-6 weeks.  Also pending neurology evaluation.  No other questions or concerns at this time.  No family present at bedside.  Denies dizziness, no visual changes, no chest pain, no palpitations, no shortness of breath, no abdominal pain, no current weakness, no cough/congestion.  No acute events overnight per nursing staff.  Objective: Vitals:   07/09/20 0050 07/09/20 0155 07/09/20 0545 07/09/20 0856  BP: (!) 146/100 (!) 145/87 (!) 140/93 (!) 156/102  Pulse: 90 78 84 73  Resp: Temp:  98.8 F (37.1 C) 98.9 F (37.2 C) 99 F (37.2 C)  TempSrc:      SpO2: 98% 99% 97% 98%  Weight:      Height:        Intake/Output Summary (Last 24 hours) at 07/09/2020 1343 Last data filed at 07/09/2020 1032 Gross per 24 hour  Intake 1360 ml  Output --  Net 1360 ml   Filed Weights   07/08/20 1540  Weight: 60 kg    Examination:  General exam: Appears calm and comfortable  Respiratory system: Clear to auscultation. Respiratory effort normal.  On room air Cardiovascular system: S1 & S2 heard, RRR. No JVD, murmurs, rubs, gallops or clicks. No pedal edema. Gastrointestinal system: Abdomen is nondistended, soft and nontender. No organomegaly or masses felt.  Normal bowel sounds heard. Central nervous system: Alert and oriented. No focal neurological deficits. Extremities: Symmetric 5 x 5 power. Skin: No rashes, lesions or ulcers Psychiatry: Judgement and insight appear normal. Mood & affect appropriate.     Data Reviewed: I have personally reviewed following labs and imaging studies  CBC: Recent Labs  Lab 07/08/20 1555 07/09/20 0646  WBC 8.1 5.5  HGB 13.6 11.9*  HCT 39.1 33.6*  MCV 96.8 98.0  PLT 195 152   Basic Metabolic Panel: Recent Labs  Lab 07/08/20 1555 07/09/20 0646  NA 137 139  K 3.1* 3.1*  CL 101 105  CO2 26 26  GLUCOSE 136* 95  BUN 20 22*  CREATININE 1.14* 0.99  CALCIUM 9.4 8.8*   GFR: Estimated Creatinine Clearance: 56.4 mL/min (by C-G formula based on SCr of 0.99 mg/dL). Liver Function Tests: Recent Labs  Lab 07/08/20 1555 07/09/20 0646  AST 19 15  ALT 12 10  ALKPHOS 67 58  BILITOT 0.8 0.7  PROT 8.3* 7.0  ALBUMIN 4.2 3.5   No results for input(s): LIPASE, AMYLASE in the last 168 hours. No results for input(s): AMMONIA in the last 168 hours. Coagulation Profile: No results for input(s): INR, PROTIME in the last 168 hours. Cardiac Enzymes: No results for input(s): CKTOTAL, CKMB, CKMBINDEX, TROPONINI in the last 168 hours. BNP (last 3 results)  No results for input(s): PROBNP in the last 8760 hours. HbA1C: No results for input(s): HGBA1C in the last 72 hours. CBG: No results for input(s): GLUCAP in the last 168 hours. Lipid Profile: No results for input(s): CHOL, HDL, LDLCALC, TRIG, CHOLHDL, LDLDIRECT in the last 72 hours. Thyroid Function Tests: No results for input(s): TSH, T4TOTAL, FREET4, T3FREE, THYROIDAB in the last 72 hours. Anemia Panel: No results for input(s): VITAMINB12, FOLATE, FERRITIN, TIBC, IRON, RETICCTPCT in the last 72 hours. Sepsis Labs: No results for input(s): PROCALCITON, LATICACIDVEN in the last 168 hours.  Recent Results (from the past 240 hour(s))  Resp Panel by  RT-PCR (Flu A&B, Covid) Nasopharyngeal Swab     Status: None   Collection Time: 07/08/20 11:55 PM   Specimen: Nasopharyngeal Swab; Nasopharyngeal(NP) swabs in vial transport medium  Result Value Ref Range Status   SARS Coronavirus 2 by RT PCR NEGATIVE NEGATIVE Final    Comment: (NOTE) SARS-CoV-2 target nucleic acids are NOT DETECTED.  The SARS-CoV-2 RNA is generally detectable in upper respiratory specimens during the acute phase of infection. The lowest concentration of SARS-CoV-2 viral copies this assay can detect is 138 copies/mL. A negative result does not preclude SARS-Cov-2 infection and should not be used as the sole basis for treatment or other patient management decisions. A negative result may occur with  improper specimen collection/handling, submission of specimen other than nasopharyngeal swab, presence of viral mutation(s) within the areas targeted by this assay, and inadequate number of viral copies(<138 copies/mL). A negative result must be combined with clinical observations, patient history, and epidemiological information. The expected result is Negative.  Fact Sheet for Patients:  BloggerCourse.com  Fact Sheet for Healthcare Providers:  SeriousBroker.it  This test is no t yet approved or cleared by the [place] FDA and  has been authorized for detection and/or diagnosis of SARS-CoV-2 by FDA under an Emergency Use Authorization (EUA). This EUA will remain  in effect (meaning this test can be used) for the duration of the COVID-19 declaration under Section 564(b)(1) of the Act, 21 U.S.C.section 360bbb-3(b)(1), unless the authorization is terminated  or revoked sooner.       Influenza A by PCR NEGATIVE NEGATIVE Final   Influenza B by PCR NEGATIVE NEGATIVE Final    Comment: (NOTE) The Xpert Xpress SARS-CoV-2/FLU/RSV plus assay is intended as an aid in the diagnosis of influenza from Nasopharyngeal swab  specimens and should not be used as a sole basis for treatment. Nasal washings and aspirates are unacceptable for Xpert Xpress SARS-CoV-2/FLU/RSV testing.  Fact Sheet for Patients: BloggerCourse.com  Fact Sheet for Healthcare Providers: SeriousBroker.it  This test is not yet approved or cleared by the [place] FDA and has been authorized for detection and/or diagnosis of SARS-CoV-2 by FDA under an Emergency Use Authorization (EUA). This EUA will remain in effect (meaning this test can be used) for the duration of the COVID-19 declaration under Section 564(b)(1) of the Act, 21 U.S.C. section 360bbb-3(b)(1), unless the authorization is terminated or revoked.  Performed at [facility], [address] [place], [place] [address]          Radiology Studies: CT Head Wo Contrast  Result Date: 07/08/2020 CLINICAL DATA:  Headache. EXAM: CT HEAD WITHOUT CONTRAST TECHNIQUE: Contiguous axial images were obtained from the base of the skull through the vertex without intravenous contrast. COMPARISON:  CT head 01/05/2015 FINDINGS: Brain: Mass lesion in the right thalamus and basal ganglia which is predominately low density. There is central high density  within the mass which could be calcification or hemorrhage. Mild mass-effect. Mild hydrocephalus is present. Hypodensity is seen throughout the cerebral white matter bilaterally with progression since 2016. Likely chronic microvascular ischemia. Vascular: Negative for hyperdense vessel Skull: Negative Sinuses/Orbits: Paranasal sinuses clear.  Negative orbit Other: None IMPRESSION: Mass in the right thalamus and basal ganglia concerning for neoplasm. Central areas of high density may represent acute hemorrhage or calcification. Possible high-grade glioma. Mild obstructive hydrocephalus. MRI brain without and with contrast recommended for further evaluation. These results were called by  telephone at the time of interpretation on 07/08/2020 at 4:30 pm to provider [facility] , who verbally acknowledged these results. Electronically Signed   By: Marlan Palau M.D.   On: 07/08/2020 16:31   MR Brain W and Wo Contrast  Result Date: 07/08/2020 CLINICAL DATA:  Initial evaluation for possible mass on prior CT. EXAM: MRI HEAD WITHOUT AND WITH CONTRAST TECHNIQUE: Multiplanar, multiecho pulse sequences of the brain and surrounding structures were obtained without and with intravenous contrast. CONTRAST:  33mL GADAVIST GADOBUTROL 1 MMOL/ML IV SOLN COMPARISON:  Prior CT from earlier the same day. FINDINGS: Brain: Cerebral volume within normal limits for age. Extensive patchy and confluent T2/FLAIR hyperintensity seen throughout the periventricular, deep, and subcortical white matter both cerebral hemispheres. Patchy involvement of the pons noted. Appearance most consistent with chronic microvascular ischemic disease, moderate to advanced in nature for age. Again seen is mass-like T2/FLAIR hyperintensity involving the right thalamus, corresponding with abnormality on prior head CT. Areas of central heterogeneous T2 hypointense, T1 hyperintense signal intensity with associated susceptibility artifact, consistent with hemorrhage/blood products. Extension to partially involve the adjacent right cerebral peduncle and midbrain. Following contrast administration, there is irregular somewhat ovoid enhancement within this region, measuring approximately 2.4 x 1.8 x 1.2 cm (series 19, image 89). Associated regional mass effect with localized right-to-left shift measuring up to approximately 4 mm. Finding is indeterminate, but given overall appearance, location, and morphology, an evolving subacute hemorrhagic infarct is favored. Possible neoplasm/mass is difficult to exclude, and would be the primary differential consideration. No other evidence for acute or subacute ischemia. Gray-white matter differentiation  otherwise maintained. No encephalomalacia to suggest chronic cortical infarction elsewhere within the brain. No other acute intracranial hemorrhage. Multiple additional scattered chronic micro hemorrhages noted clustered about the deep gray nuclei and brainstem, likely reflecting changes of chronic poorly controlled hypertension. This finding also suggest at the right thalamic abnormality may reflect a subacute hemorrhage. No other mass lesion or mass effect. No other abnormal enhancement. Ventricles remain within normal limits for size without significant hydrocephalus at this time. No visible intraventricular blood products. Pituitary gland and suprasellar region within normal limits. Midline structures intact. Vascular: Major intracranial vascular flow voids are maintained. Skull and upper cervical spine: Craniocervical junction within normal limits. Upper cervical spine normal. Bone marrow signal intensity within normal limits. No focal marrow replacing lesion. No scalp soft tissue abnormality. Sinuses/Orbits: Globes and orbital soft tissues demonstrate no acute finding. Paranasal sinuses are largely clear. No mastoid effusion. Inner ear structures grossly normal. Other: None. IMPRESSION: 1. 2.4 x 1.8 x 1.2 cm mass-like signal abnormality involving the right thalamus with associated regional mass effect and localized 4 mm of right-to-left shift. Finding is indeterminate, but given overall appearance, location and morphology, an evolving subacute hemorrhage/hemorrhagic infarct is favored. Possible primary CNS neoplasm/mass is difficult to exclude, and would be the primary differential consideration. A short interval follow-up MRI in perhaps 1 week suggested for further evaluation. 2.  Multiple additional scattered chronic micro hemorrhages clustered about the deep gray nuclei and brainstem, likely related to chronic poorly controlled hypertension. Finding also suggest at the right thalamic abnormality could  reflect a hemorrhage rather than neoplasm. 3. Underlying moderate to advanced chronic microvascular ischemic disease. Electronically Signed   By: Rise Mu M.D.   On: 07/08/2020 22:19     Scheduled Meds: Continuous Infusions:   LOS: 1 day    Time spent: 39 minutes spent on chart review, discussion with nursing staff, consultants, updating family and interview/physical exam; more than 50% of that time was spent in counseling and/or coordination of care.    Alvira Philips [place], DO [facility] Available via Epic secure chat 7am-7pm After these hours, please refer to coverage provider listed on [email or web address] 07/09/2020, 1:43 PM

## 2020-07-09 NOTE — Progress Notes (Signed)
OT Cancellation Note  Patient Details Name: Rebecca Yang MRN: [id number] DOB: [birth year]   Cancelled Treatment:    Reason Eval/Treat Not Completed: Patient not medically ready. Consult received, chart reviewed. Per neurosurgery, imaging concerning for "underlying neoplasm" vs "hemorrhagic conversion of stroke or a hemorrhagic stroke." MRI reveals 77mm R to L shift. Neurology consult pending. Will hold OT evaluation at this time and re-attempt at later date/time pending neurology consult and updated plan of care and guidance for ADL/mobility.   Wynona Canes, MPH, MS, OTR/L ascom [phone] 07/09/20, 8:53 AM

## 2020-07-09 NOTE — Evaluation (Signed)
Physical Therapy Evaluation Patient Details Name: Rebecca Yang MRN: [id number] DOB: [birth year] Today's Date: 07/09/2020   History of Present Illness  Rebecca Yang is a 55 y.o. female with a known history of HTN, sarcoidosis presents to the emergency department for evaluation of headache.  Patient was in a usual state of health until four days ago when she developed intractable headache, hasn't had one in over 20 years.  Symptoms are associated with  RUE weakness associated with phono and photophobia, nausea.  States that her speech and vision are unaffected but she didn't feel like talking due to severe headache.  She thought her HA was related to high blood pressure because she has had a poor diet recently but hasn't checked her BP at  home.    Clinical Impression  Patient received in bed, she reports headache, but states she is feeling better. RN in room upon arrival and clears patient for mobility stating she had been up to the bathroom several times. Patient is mod independent with bed mobility, transfers with min guard and ambulated 130 feet with min guard/supervision. She is slightly unsteady with gait, but no overt lob. Patient will continue to benefit from skilled PT while here to improve strength and functional independence.      Follow Up Recommendations Home health PT    Equipment Recommendations  Cane;Other (comment) (still to be determined)    Recommendations for Other Services       Precautions / Restrictions Precautions Precaution Comments: low fall risk Restrictions Weight Bearing Restrictions: No      Mobility  Bed Mobility Overal bed mobility: Modified Independent                  Transfers Overall transfer level: Modified independent Equipment used: None                Ambulation/Gait Ambulation/Gait assistance: Min guard;Supervision Gait Distance (Feet): 130 Feet Assistive device: 1 person hand held assist;None Gait Pattern/deviations:  Step-through pattern;Narrow base of support;Staggering left;Staggering right Gait velocity: decreased   General Gait Details: patient slightly unsteady with ambulation, may benefit from [facility] for steadying.  Stairs            Wheelchair Mobility    Modified Rankin (Stroke Patients Only)       Balance Overall balance assessment: Needs assistance Sitting-balance support: Feet supported Sitting balance-Leahy Scale: Good     Standing balance support: No upper extremity supported;During functional activity Standing balance-Leahy Scale: Fair Standing balance comment: slightly unsteady, unsure if due to fatigue, headache or her norm. She states her balance is a little "off", but reports no falls.                             Pertinent Vitals/Pain Pain Assessment: Faces Faces Pain Scale: Hurts whole lot Pain Location: head Pain Descriptors / Indicators: Headache Pain Intervention(s): Premedicated before session    Home Living Family/patient expects to be discharged to:: Private residence Living Arrangements: Children;Spouse/significant other Available Help at Discharge: Family;Available PRN/intermittently Type of Home: House Home Access: Stairs to enter Entrance Stairs-Rails: Right Entrance Stairs-Number of Steps: flight Home Layout: One level Home Equipment: None      Prior Function Level of Independence: Independent               Hand Dominance        Extremity/Trunk Assessment   Upper Extremity Assessment Upper Extremity Assessment: Generalized weakness;Defer to OT evaluation  Lower Extremity Assessment Lower Extremity Assessment: Generalized weakness    Cervical / Trunk Assessment Cervical / Trunk Assessment: Normal  Communication   Communication: No difficulties;Other (comment) (sometimes garbled speech)  Cognition Arousal/Alertness: Awake/alert Behavior During Therapy: WFL for tasks assessed/performed Overall Cognitive Status:  Within Functional Limits for tasks assessed                                 General Comments: appears to have decreased safety awareness.      General Comments      Exercises     Assessment/Plan    PT Assessment Patient needs continued PT services  PT Problem List Decreased strength;Decreased mobility;Decreased activity tolerance;Decreased balance;Pain;Decreased knowledge of use of DME;Decreased safety awareness       PT Treatment Interventions DME instruction;Gait training;Therapeutic exercise;Balance training;Stair training;Functional mobility training;Therapeutic activities;Patient/family education    PT Goals (Current goals can be found in the Care Plan section)  Acute Rehab PT Goals Patient Stated Goal: to decrease pain PT Goal Formulation: With patient Time For Goal Achievement: 07/23/20 Potential to Achieve Goals: Fair    Frequency Min 2X/week   Barriers to discharge Inaccessible home environment has 12 steps to enter home with handrail    Co-evaluation               AM-PAC PT "6 Clicks" Mobility  Outcome Measure Help needed turning from your back to your side while in a flat bed without using bedrails?: None Help needed moving from lying on your back to sitting on the side of a flat bed without using bedrails?: None Help needed moving to and from a bed to a chair (including a wheelchair)?: A Little Help needed standing up from a chair using your arms (e.g., wheelchair or bedside chair)?: A Little Help needed to walk in hospital room?: A Little Help needed climbing 3-5 steps with a railing? : A Little 6 Click Score: 20    End of Session   Activity Tolerance: Patient limited by fatigue Patient left: in bed;with call bell/phone within reach;with family/visitor present Nurse Communication: Mobility status PT Visit Diagnosis: Unsteadiness on feet (R26.81);Muscle weakness (generalized) (M62.81)    Time: 0093-8182 PT Time Calculation (min)  (ACUTE ONLY): 14 min   Charges:   PT Evaluation $PT Eval Low Complexity: 1 Low          Rebecca Yang, PT, GCS 07/09/20,1:46 PM

## 2020-07-09 NOTE — Consult Note (Signed)
Referring Physician:  No referring provider defined for this encounter.  Primary Physician:  Patient, No Pcp Per (Inactive)  Chief Complaint:  headache  History of Present Illness: 07/09/2020 Rebecca Yang is a 55 y.o. female who presents with the chief complaint of headache for several days.  She presented to ER with 4-5 days of headache, which is unusual for her.  She has had some RUE weakness.  She has not had any falls.  She is currently cognitively well, but continues to have headache.  No seizures or other neurologic concerns.  Review of Systems:  A 10 point review of systems is negative, except for the pertinent positives and negatives detailed in the HPI.  Past Medical History: Past Medical History:  Diagnosis Date  . Hypertension   . Sarcoidosis     Past Surgical History: Past Surgical History:  Procedure Laterality Date  . BREAST SURGERY  2000   lumpectomy  . HAND SURGERY    . SKIN SURGERY      Allergies: Allergies as of 07/08/2020 - Review Complete 07/08/2020  Allergen Reaction Noted  . Penicillins Hives 01/05/2015    Medications:  Current Facility-Administered Medications:  .  acetaminophen (TYLENOL) tablet 650 mg, 650 mg, Oral, Q6H PRN **OR** acetaminophen (TYLENOL) suppository 650 mg, 650 mg, Rectal, Q6H PRN, Hugelmeyer, Alexis, DO .  albuterol (PROVENTIL) (2.5 MG/3ML) 0.083% nebulizer solution 2.5 mg, 2.5 mg, Nebulization, Q6H PRN, Hugelmeyer, Alexis, DO .  bisacodyl (DULCOLAX) EC tablet 5 mg, 5 mg, Oral, Daily PRN, Hugelmeyer, Alexis, DO .  ipratropium (ATROVENT) nebulizer solution 0.5 mg, 0.5 mg, Nebulization, Q6H PRN, Hugelmeyer, Alexis, DO .  magnesium citrate solution 1 Bottle, 1 Bottle, Oral, Once PRN, Hugelmeyer, Alexis, DO .  morphine 2 MG/ML injection 1 mg, 1 mg, Intravenous, Q6H PRN, Hugelmeyer, Alexis, DO .  ondansetron (ZOFRAN) tablet 4 mg, 4 mg, Oral, Q6H PRN **OR** ondansetron (ZOFRAN) injection 4 mg, 4 mg, Intravenous, Q6H PRN,  Hugelmeyer, Alexis, DO .  oxyCODONE (Oxy IR/ROXICODONE) immediate release tablet 5 mg, 5 mg, Oral, Q4H PRN, Hugelmeyer, Alexis, DO .  senna-docusate (Senokot-S) tablet 1 tablet, 1 tablet, Oral, QHS PRN, Hugelmeyer, Alexis, DO .  traZODone (DESYREL) tablet 25 mg, 25 mg, Oral, QHS PRN, Hugelmeyer, Alexis, DO   Social History: Social History   Tobacco Use  . Smoking status: Current Every Day Smoker    Packs/day: 0.20  . Smokeless tobacco: Never Used  Substance Use Topics  . Alcohol use: Yes    Comment: occas, last drink 07/04/20   . Drug use: Never    Family Medical History: History reviewed. No pertinent family history.  Physical Examination: Vitals:   07/09/20 0155 07/09/20 0545  BP: (!) 145/87 (!) 140/93  Pulse: 78 84  Resp: 18 18  Temp: 98.8 F (37.1 C) 98.9 F (37.2 C)  SpO2: 99% 97%     General: Patient is well developed, well nourished, calm, collected, and in no apparent distress.  Psychiatric: Patient is non-anxious.  Head:  Pupils equal, round, and reactive to light.  ENT:  Oral mucosa appears well hydrated.  Neck:   Supple.  Full range of motion.  Respiratory: Patient is breathing without any difficulty.  Extremities: No edema.  Vascular: Palpable pulses in dorsal pedal vessels.  Skin:   On exposed skin, there are no abnormal skin lesions.  NEUROLOGICAL:  General: In no acute distress.   Awake, alert, oriented to person, place, and time.  Pupils equal round and reactive to light.  Facial  tone is symmetric.  Tongue protrusion is midline.  There is no pronator drift.   Strength: Side Biceps Triceps Deltoid Interossei Grip Wrist Ext. Wrist Flex.  R 5 5 5 5 5 5 5   L 5 5 5 5 5 5 5    Side Iliopsoas Quads Hamstring PF DF EHL  R 5 5 5 5 5 5   L 5 5 5 5 5 5     Bilateral upper and lower extremity sensation is intact to light touch. Reflexes are 1+ and symmetric at the biceps, triceps, brachioradialis, patella and achilles. Hoffman's is  absent.  Clonus is not present.  Toes are down-going.    Gait is untested.  Imaging: MRI brain 07/08/20 IMPRESSION: 1. 2.4 x 1.8 x 1.2 cm mass-like signal abnormality involving the right thalamus with associated regional mass effect and localized 4 mm of right-to-left shift. Finding is indeterminate, but given overall appearance, location and morphology, an evolving subacute hemorrhage/hemorrhagic infarct is favored. Possible primary CNS neoplasm/mass is difficult to exclude, and would be the primary differential consideration. A short interval follow-up MRI in perhaps 1 week suggested for further evaluation. 2. Multiple additional scattered chronic micro hemorrhages clustered about the deep gray nuclei and brainstem, likely related to chronic poorly controlled hypertension. Finding also suggest at the right thalamic abnormality could reflect a hemorrhage rather than neoplasm. 3. Underlying moderate to advanced chronic microvascular ischemic disease.   Electronically Signed   By: M.D.   On: 07/08/2020 22:19  I have personally reviewed the images and agree with the above interpretation.  Labs: CBC Latest Ref Rng & Units 07/08/2020 05/03/2013  WBC 4.0 - 10.5 K/uL 8.1 14.1(H)  Hemoglobin 12.0 - 15.0 g/dL Rise Mu 07/10/2020  Hematocrit 07/10/2020 - 46.0 % 39.1 38.1  Platelets 150 - 400 K/uL 195 174       Assessment and Plan: Ms. Wieland is a pleasant 55 y.o. female with R thalamic hyperdense lesion concerning for underlying neoplasm.  Due to the presence of blood products, this could also represent a hemorrhage conversion of stroke or a hemorrhagic stroke.  - Ok to give steroid trial to see if that improves headache - PTOT evaluation - will need repeat MRI in 4-6 weeks to more fully evaluate the lesion and determine etiology.  If neoplasm, biopsy will probably be necessary. - Would appreciate neurology consultation today.    Stephanye Finnicum K. 47.8 MD, MPHS Dept. of  Neurosurgery

## 2020-07-09 NOTE — Progress Notes (Signed)
Met with the patient at the bedside with her son, She does not have any insurance, Provided her with [facility] application and sent referral to them, Added Medication Mgt to system and explained that she can get non narcotic meds at no cost there, Set up with [facility] PT with charity thru [place] [facility] to get a 3 in 1 and a cane from charity with adapt Has transportation

## 2020-07-10 ENCOUNTER — Inpatient Hospital Stay: Payer: Self-pay

## 2020-07-10 ENCOUNTER — Encounter: Payer: Self-pay | Admitting: Family Medicine

## 2020-07-10 DIAGNOSIS — I10 Essential (primary) hypertension: Secondary | ICD-10-CM

## 2020-07-10 DIAGNOSIS — I619 Nontraumatic intracerebral hemorrhage, unspecified: Secondary | ICD-10-CM

## 2020-07-10 LAB — BASIC METABOLIC PANEL
Anion gap: 7 (ref 5–15)
BUN: 17 mg/dL (ref 6–20)
CO2: 24 mmol/L (ref 22–32)
Calcium: 9.5 mg/dL (ref 8.9–10.3)
Chloride: 106 mmol/L (ref 98–111)
Creatinine, Ser: 0.83 mg/dL (ref 0.44–1.00)
GFR, Estimated: >60 mL/min (ref >60)
Glucose, Bld: 160 mg/dL — ABNORMAL HIGH (ref 70–99)
Potassium: 4.5 mmol/L (ref 3.5–5.1)
Sodium: 137 mmol/L (ref 135–145)

## 2020-07-10 LAB — CBC
HCT: 35.7 % — ABNORMAL LOW (ref 36.0–46.0)
Hemoglobin: 12.5 g/dL (ref 12.0–15.0)
MCH: 34 pg (ref 26.0–34.0)
MCHC: 35 g/dL (ref 30.0–36.0)
MCV: 97 fL (ref 80.0–100.0)
Platelets: 193 10*3/uL (ref 150–400)
RBC: 3.68 MIL/uL — ABNORMAL LOW (ref 3.87–5.11)
RDW: 11.9 % (ref 11.5–15.5)
WBC: 5.4 10*3/uL (ref 4.0–10.5)
nRBC: 0 % (ref 0.0–0.2)

## 2020-07-10 LAB — HEMOGLOBIN A1C
Hgb A1c MFr Bld: 5.2 % (ref 4.8–5.6)
Mean Plasma Glucose: 102.54 mg/dL

## 2020-07-10 LAB — MAGNESIUM: Magnesium: 2 mg/dL (ref 1.7–2.4)

## 2020-07-10 MED ORDER — AMLODIPINE BESYLATE 10 MG PO TABS
10.0000 mg | ORAL_TABLET | Freq: Every day | ORAL | Status: DC
  Administered 2020-07-10 – 2020-07-11 (×2): 10 mg via ORAL
  Filled 2020-07-10 (×2): qty 1

## 2020-07-10 MED ORDER — LABETALOL HCL 5 MG/ML IV SOLN: 10.0000 mg | INTRAVENOUS | Status: DC | PRN

## 2020-07-10 MED ORDER — PROCHLORPERAZINE EDISYLATE 10 MG/2ML IJ SOLN
10.0000 mg | Freq: Once | INTRAMUSCULAR | Status: DC
  Filled 2020-07-10: qty 2

## 2020-07-10 MED ORDER — IOHEXOL 350 MG/ML SOLN
75.0000 mL | Freq: Once | INTRAVENOUS | Status: AC | PRN
  Administered 2020-07-10: 75 mL via INTRAVENOUS

## 2020-07-10 MED ORDER — STROKE: EARLY STAGES OF RECOVERY BOOK: Freq: Once | Status: DC

## 2020-07-10 NOTE — Progress Notes (Signed)
PROGRESS NOTE    Rebecca Yang  OHY:[id number] DOB: [birth year] DOA: 07/08/2020 PCP: Patient, No Pcp Per (Inactive)    Brief Narrative:  Rebecca Yang is a 55 year old female with past medical history significant for essential hypertension, sarcoidosis who presents to the emergency department for evaluation of persistent headache.  Patient reports onset 4 days prior which is now becoming intractable headache.  No headaches for 20 years.  Patient also reports some mild right upper extremity weakness associated with phono and photophobia with nausea.  Patient reports that her speech and vision are unaffected.  She initially thought her headache was related to high blood pressure because her diet has been poor recently has not checked her BP at home.  Patient further denies fever/chills, no weakness, no dizziness, no chest pain, no shortness of breath, no nausea/vomiting/diarrhea, no abdominal pain, no dysuria/frequency, no confusion.  No recent change in home medications, no recent illness, no hospitalizations, no travel or sick contacts.  In the ED, temperature 9 9.7 F, HR 87, RR 17, BP 151/103, SPO2 96% on room air.  Sodium 137, potassium 3.1, chloride 101, CO2 26, glucose 136, BUN 20, creatinine 1.14.  AST 19, ALT 12.  Total bilirubin 0.8.  WBC 8.1, hemoglobin 13.6, platelets 195.  COVID-19 PCR negative.  Influenza A/B PCR negative.  Urinalysis unrevealing.  CT head without contrast with mass in the right thalamus and basal ganglia concerning for neoplasm, central areas of high density may represent acute hemorrhage or calcification, possible high-grade glioma.  MR brain with and without contrast with findings of 2.1 x 1.8 x 1.2 cm masslike abnormality right thalamus with associated regional mass-effect, 4 mm right to left shift concerning for evolving subacute hemorrhage/hemorrhagic infarct versus primary CNS neoplasm.  Medrol, Toradol, Reglan and normal saline.  EDP discussed with neurosurgery who  requested inpatient mission for neurology consult.  Hospitalist service consulted for further evaluation and management.   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  CT/MRI findings concerning for 2.1 x 1.8 x 1.2 cm masslike abnormality right thalamus with regional mass-effect concerning for subacute hemorrhage/hemorrhagic infarct versus primary CNS neoplasm.   Patient was seen by neurosurgery, Dr. Marcell Barlow on 4/29, with recommendations of repeat MR 4-6 weeks and PT/OT evaluation.  No acute need for surgical intervention at this time and if neoplasm biopsy would probably be necessary dependent on repeat scan. --Neurology consulted, appreciate assistance --BP Control: SBP>140 --antihypertensives as below  --Decadron 10 mg IV x1 followed by 4 mg IV every 6 hours --PT recommends HH --Supportive care, Tylenol, oxycodone, morphine --Repeat MRI in 4-6 weeks --Avoid anticoagulants and antithrombotics  Essential hypertension Not on home antihypertensive regimen.   4/30: BP's still elevated above goal, adjusting regimen --increase amlodipine 5 >>10 mg PO daily --IV labetalol PRN SBP>160 --monitor BP closely  History of sarcoidosis Patient follows with [facility] pulmonology outpatient, Dr. Meredeth Ide; last seen per EMR 2015. --Recommend further outpatient follow-up   DVT prophylaxis: DVT prophylaxis contraindicated with concern of possible intracranial hemorrhage   Code Status: Full Code Family Communication: Family present at bedside this morning.  Disposition Plan:  Level of care: Med-Surg Status is: Inpatient  Remains inpatient appropriate because:Ongoing diagnostic testing needed not appropriate for outpatient work up, Unsafe d/c  plan, IV treatments appropriate due  to intensity of illness or inability to take PO and Inpatient level of care appropriate due to severity of illness   Dispo: The patient is from: Home              Anticipated d/c is to: Home              Patient currently is not medically stable to d/c.   Difficult to place patient No  Consultants:   Neurosurgery, Dr. Marcell Barlow  Neurology, Dr. Amada Jupiter  Procedures:   None  Antimicrobials:   None   Subjective: Patient seen this AM, seated edge of bed.  Reports headache is a lot better.  She coughed once and has head pain with that.  She says had some R-sided weakness days ago that has almost fully resolved, not quite at her baselines.   Objective: Vitals:   07/09/20 1701 07/09/20 1951 07/10/20 0426 07/10/20 0818  BP: (!) 151/112 (!) 152/99 (!) 152/99 (!) 152/121  Pulse: 84 82 64 66  Resp: 16 16 17 18   Temp: 99.4 F (37.4 C) 98.2 F (36.8 C) 98.3 F (36.8 C) 98.7 F (37.1 C)  TempSrc:  Oral Oral   SpO2: 95% 95% 97% 100%  Weight:      Height:        Intake/Output Summary (Last 24 hours) at 07/10/2020 1413 Last data filed at 07/10/2020 1356 Gross per 24 hour  Intake 480 ml  Output --  Net 480 ml   Filed Weights   07/08/20 1540  Weight: 60 kg    Examination:  General exam: awake, alert, NAD, seated edge of bed  Respiratory system: CTAB. No wheezes or rhonchi. Respiratory effort normal.  On room air Cardiovascular system: S1 & S2 heard, RRR. No peripheral edema. Gastrointestinal system: Abdomen soft and non-tender. Central nervous system: Alert and orientedx3. Normal speech.  5/5 symmetric upper extremity strength, CN's grossly intact. Skin: dry intact, normal temp Psychiatry: Judgement and insight appear normal. Mood & affect appropriate.     Data Reviewed: I have personally reviewed following labs and imaging studies  CBC: Recent Labs  Lab 07/08/20 1555 07/09/20 0646 07/10/20 0537  WBC 8.1 5.5 5.4  HGB 13.6  11.9* 12.5  HCT 39.1 33.6* 35.7*  MCV 96.8 98.0 97.0  PLT 195 152 193   Basic Metabolic Panel: Recent Labs  Lab 07/08/20 1555 07/09/20 0646 07/10/20 0537  NA 137 139 137  K 3.1* 3.1* 4.5  CL 101 105 106  CO2 26 26 24   GLUCOSE 136* 95 160*  BUN 20 22* 17  CREATININE 1.14* 0.99 0.83  CALCIUM 9.4 8.8* 9.5  MG  --   --  2.0   GFR: Estimated Creatinine Clearance: 67.3 mL/min (by C-G formula based on SCr of 0.83 mg/dL). Liver Function Tests: Recent Labs  Lab 07/08/20 1555 07/09/20 0646  AST 19 15  ALT 12 10  ALKPHOS 67 58  BILITOT 0.8 0.7  PROT 8.3* 7.0  ALBUMIN 4.2 3.5   No results for input(s): LIPASE, AMYLASE in the last 168 hours. No results for input(s): AMMONIA in the last 168 hours. Coagulation Profile: No results for input(s): INR, PROTIME in the last 168 hours. Cardiac Enzymes: No results for input(s): CKTOTAL, CKMB, CKMBINDEX, TROPONINI in the last 168 hours. BNP (last 3 results) No results for input(s): PROBNP in the last 8760 hours. HbA1C: No results for input(s): HGBA1C in the last 72 hours. CBG: No results for input(s): GLUCAP in the last 168 hours. Lipid Profile: No  results for input(s): CHOL, HDL, LDLCALC, TRIG, CHOLHDL, LDLDIRECT in the last 72 hours. Thyroid Function Tests: No results for input(s): TSH, T4TOTAL, FREET4, T3FREE, THYROIDAB in the last 72 hours. Anemia Panel: No results for input(s): VITAMINB12, FOLATE, FERRITIN, TIBC, IRON, RETICCTPCT in the last 72 hours. Sepsis Labs: No results for input(s): PROCALCITON, LATICACIDVEN in the last 168 hours.  Recent Results (from the past 240 hour(s))  Resp Panel by RT-PCR (Flu A&B, Covid) Nasopharyngeal Swab     Status: None   Collection Time: 07/08/20 11:55 PM   Specimen: Nasopharyngeal Swab; Nasopharyngeal(NP) swabs in vial transport medium  Result Value Ref Range Status   SARS Coronavirus 2 by RT PCR NEGATIVE NEGATIVE Final    Comment: (NOTE) SARS-CoV-2 target nucleic acids are NOT  DETECTED.  The SARS-CoV-2 RNA is generally detectable in upper respiratory specimens during the acute phase of infection. The lowest concentration of SARS-CoV-2 viral copies this assay can detect is 138 copies/mL. A negative result does not preclude SARS-Cov-2 infection and should not be used as the sole basis for treatment or other patient management decisions. A negative result may occur with  improper specimen collection/handling, submission of specimen other than nasopharyngeal swab, presence of viral mutation(s) within the areas targeted by this assay, and inadequate number of viral copies(<138 copies/mL). A negative result must be combined with clinical observations, patient history, and epidemiological information. The expected result is Negative.  Fact Sheet for Patients:  BloggerCourse.com  Fact Sheet for Healthcare Providers:  SeriousBroker.it  This test is no t yet approved or cleared by the [place] FDA and  has been authorized for detection and/or diagnosis of SARS-CoV-2 by FDA under an Emergency Use Authorization (EUA). This EUA will remain  in effect (meaning this test can be used) for the duration of the COVID-19 declaration under Section 564(b)(1) of the Act, 21 U.S.C.section 360bbb-3(b)(1), unless the authorization is terminated  or revoked sooner.       Influenza A by PCR NEGATIVE NEGATIVE Final   Influenza B by PCR NEGATIVE NEGATIVE Final    Comment: (NOTE) The Xpert Xpress SARS-CoV-2/FLU/RSV plus assay is intended as an aid in the diagnosis of influenza from Nasopharyngeal swab specimens and should not be used as a sole basis for treatment. Nasal washings and aspirates are unacceptable for Xpert Xpress SARS-CoV-2/FLU/RSV testing.  Fact Sheet for Patients: BloggerCourse.com  Fact Sheet for Healthcare Providers: SeriousBroker.it  This test is not yet  approved or cleared by the [place] FDA and has been authorized for detection and/or diagnosis of SARS-CoV-2 by FDA under an Emergency Use Authorization (EUA). This EUA will remain in effect (meaning this test can be used) for the duration of the COVID-19 declaration under Section 564(b)(1) of the Act, 21 U.S.C. section 360bbb-3(b)(1), unless the authorization is terminated or revoked.  Performed at [facility], [address], [place], [place] [address]          Radiology Studies: CT Head Wo Contrast  Result Date: 07/08/2020 CLINICAL DATA:  Headache. EXAM: CT HEAD WITHOUT CONTRAST TECHNIQUE: Contiguous axial images were obtained from the base of the skull through the vertex without intravenous contrast. COMPARISON:  CT head 01/05/2015 FINDINGS: Brain: Mass lesion in the right thalamus and basal ganglia which is predominately low density. There is central high density within the mass which could be calcification or hemorrhage. Mild mass-effect. Mild hydrocephalus is present. Hypodensity is seen throughout the cerebral white matter bilaterally with progression since 2016. Likely chronic microvascular ischemia. Vascular: Negative for  hyperdense vessel Skull: Negative Sinuses/Orbits: Paranasal sinuses clear.  Negative orbit Other: None IMPRESSION: Mass in the right thalamus and basal ganglia concerning for neoplasm. Central areas of high density may represent acute hemorrhage or calcification. Possible high-grade glioma. Mild obstructive hydrocephalus. MRI brain without and with contrast recommended for further evaluation. These results were called by telephone at the time of interpretation on 07/08/2020 at 4:30 pm to provider [facility] , who verbally acknowledged these results. Electronically Signed   By: Marlan Palau M.D.   On: 07/08/2020 16:31   MR Brain W and Wo Contrast  Result Date: 07/08/2020 CLINICAL DATA:  Initial evaluation for possible mass on prior CT. EXAM:  MRI HEAD WITHOUT AND WITH CONTRAST TECHNIQUE: Multiplanar, multiecho pulse sequences of the brain and surrounding structures were obtained without and with intravenous contrast. CONTRAST:  59mL GADAVIST GADOBUTROL 1 MMOL/ML IV SOLN COMPARISON:  Prior CT from earlier the same day. FINDINGS: Brain: Cerebral volume within normal limits for age. Extensive patchy and confluent T2/FLAIR hyperintensity seen throughout the periventricular, deep, and subcortical white matter both cerebral hemispheres. Patchy involvement of the pons noted. Appearance most consistent with chronic microvascular ischemic disease, moderate to advanced in nature for age. Again seen is mass-like T2/FLAIR hyperintensity involving the right thalamus, corresponding with abnormality on prior head CT. Areas of central heterogeneous T2 hypointense, T1 hyperintense signal intensity with associated susceptibility artifact, consistent with hemorrhage/blood products. Extension to partially involve the adjacent right cerebral peduncle and midbrain. Following contrast administration, there is irregular somewhat ovoid enhancement within this region, measuring approximately 2.4 x 1.8 x 1.2 cm (series 19, image 89). Associated regional mass effect with localized right-to-left shift measuring up to approximately 4 mm. Finding is indeterminate, but given overall appearance, location, and morphology, an evolving subacute hemorrhagic infarct is favored. Possible neoplasm/mass is difficult to exclude, and would be the primary differential consideration. No other evidence for acute or subacute ischemia. Gray-white matter differentiation otherwise maintained. No encephalomalacia to suggest chronic cortical infarction elsewhere within the brain. No other acute intracranial hemorrhage. Multiple additional scattered chronic micro hemorrhages noted clustered about the deep gray nuclei and brainstem, likely reflecting changes of chronic poorly controlled hypertension. This  finding also suggest at the right thalamic abnormality may reflect a subacute hemorrhage. No other mass lesion or mass effect. No other abnormal enhancement. Ventricles remain within normal limits for size without significant hydrocephalus at this time. No visible intraventricular blood products. Pituitary gland and suprasellar region within normal limits. Midline structures intact. Vascular: Major intracranial vascular flow voids are maintained. Skull and upper cervical spine: Craniocervical junction within normal limits. Upper cervical spine normal. Bone marrow signal intensity within normal limits. No focal marrow replacing lesion. No scalp soft tissue abnormality. Sinuses/Orbits: Globes and orbital soft tissues demonstrate no acute finding. Paranasal sinuses are largely clear. No mastoid effusion. Inner ear structures grossly normal. Other: None. IMPRESSION: 1. 2.4 x 1.8 x 1.2 cm mass-like signal abnormality involving the right thalamus with associated regional mass effect and localized 4 mm of right-to-left shift. Finding is indeterminate, but given overall appearance, location and morphology, an evolving subacute hemorrhage/hemorrhagic infarct is favored. Possible primary CNS neoplasm/mass is difficult to exclude, and would be the primary differential consideration. A short interval follow-up MRI in perhaps 1 week suggested for further evaluation. 2. Multiple additional scattered chronic micro hemorrhages clustered about the deep gray nuclei and brainstem, likely related to chronic poorly controlled hypertension. Finding also suggest at the right thalamic abnormality could reflect a hemorrhage rather than  neoplasm. 3. Underlying moderate to advanced chronic microvascular ischemic disease. Electronically Signed   By: Rise MuBenjamin  McClintock M.D.   On: 07/08/2020 22:19     Scheduled Meds: .  stroke: mapping our early stages of recovery book   Does not apply Once  . amLODipine  10 mg Oral Daily  .  dexamethasone (DECADRON) injection  4 mg Intravenous Q6H  . prochlorperazine  10 mg Intravenous Once   Continuous Infusions:   LOS: 2 days    Time spent: 30 minutes with > 50% spent at bedside and/or coordination of care.    Pennie BanterKelly A Octavio Matheney, DO [facility] Available via Epic secure chat 7am-7pm After these hours, please refer to coverage provider listed on [email or web address] 07/10/2020, 2:13 PM

## 2020-07-10 NOTE — Evaluation (Signed)
Occupational Therapy Evaluation Patient Details Name: Rebecca Yang MRN: [id number] DOB: 1965-08-04 Today's Date: 07/10/2020    History of Present Illness Rebecca Yang is a 55 y.o. female with a known history of HTN, sarcoidosis presents to the emergency department for evaluation of headache.  Patient was in a usual state of health until four days ago when she developed intractable headache, hasn't had one in over 20 years.  Symptoms are associated with  RUE weakness associated with phono and photophobia, nausea.  States that her speech and vision are unaffected but she didn't feel like talking due to severe headache.  She thought her HA was related to high blood pressure because she has had a poor diet recently but hasn't checked her BP at  home.   Clinical Impression   Pt seen for OT evaluation this date in setting of acute hospitalization. Pt reports being INDEP at baseline and her son who is present throughout confirms. Pt is oriented, but noted to have some slow processing/delayed responses and is sometimes perseverative on tasks requiring gentle re-direction to start a new task. Pt's son reports that has only been for ~2 days that he has noticed. Upon OT assessment, pt is able to perform all tasks at [facility] to MOD I level with no physical assistance or AD used. Very occasional verbal cues given to direct pt, but she is overall able to complete familiar tasks without cues such as a commode transfer. Transport presents to take pt to CT and session concluded. No focal deficits noted this date that are appropriate to f/u with OT services. Will complete order at this time.     Follow Up Recommendations  No OT follow up    Equipment Recommendations  Tub/shower seat    Recommendations for Other Services       Precautions / Restrictions Precautions Precaution Comments: low fall risk Restrictions Weight Bearing Restrictions: No      Mobility Bed Mobility Overal bed mobility: Modified  Independent                  Transfers Overall transfer level: Modified independent Equipment used: None                  Balance Overall balance assessment: Needs assistance Sitting-balance support: Feet supported Sitting balance-Leahy Scale: Normal     Standing balance support: No upper extremity supported;During functional activity Standing balance-Leahy Scale: Good Standing balance comment: no LOB this date, no staggering noted, pt and pt's son who is present in room verbalize believing her balance is improved today versus yesterday.                           ADL either performed or assessed with clinical judgement   ADL Overall ADL's : Modified independent                                       General ADL Comments: pt noted to be somewhat perseverative and require some cues to initiate, but is mostly able to compelte familiar tasks without cues and requires no physical assist or AD to safely complete ADLs/ADL mobility on assessment this date.     Vision Patient Visual Report: No change from baseline Vision Assessment?: Yes Eye Alignment: Within Functional Limits Ocular Range of Motion: Within Functional Limits Alignment/Gaze Preference: Within Defined Limits Tracking/Visual Pursuits: Able to track  stimulus in all quads without difficulty Saccades: Within functional limits Convergence: Within functional limits     Perception     Praxis      Pertinent Vitals/Pain Pain Assessment: Faces Faces Pain Scale: No hurt     Hand Dominance     Extremity/Trunk Assessment Upper Extremity Assessment Upper Extremity Assessment: Overall WFL for tasks assessed;Generalized weakness (sensation and coordination in tact and equal bilaterally. Pt with decreased task initiation/termination.)   Lower Extremity Assessment Lower Extremity Assessment: Overall WFL for tasks assessed;Generalized weakness (ROM WFL, MMT grossly 4/5)   Cervical /  Trunk Assessment Cervical / Trunk Assessment: Normal   Communication Communication Communication: No difficulties;Other (comment) (intermittent mumbling)   Cognition Arousal/Alertness: Awake/alert Behavior During Therapy: WFL for tasks assessed/performed Overall Cognitive Status: Within Functional Limits for tasks assessed                                 General Comments: somewhat decreased insight into deficits, somewhat perseverative on tasks that are assessed (ex: still opposing digits when OT has to re-direct her to move onto visual assessment). Appropriately follows all commands and oriented x3   General Comments       Exercises Other Exercises Other Exercises: OT ed re: role of OT in acute setting   Shoulder Instructions      Home Living Family/patient expects to be discharged to:: Private residence Living Arrangements: Children;Spouse/significant other Available Help at Discharge: Family;Available PRN/intermittently Type of Home: House Home Access: Stairs to enter [employer] of Steps: flight Entrance Stairs-Rails: Right Home Layout: One level               Home Equipment: None          Prior Functioning/Environment Level of Independence: Independent                 OT Problem List: Decreased cognition      OT Treatment/Interventions:      OT Goals(Current goals can be found in the care plan section) Acute Rehab OT Goals Patient Stated Goal: to decrease pain OT Goal Formulation: All assessment and education complete, DC therapy  OT Frequency:     Barriers to D/C:            Co-evaluation              AM-PAC OT "6 Clicks" Daily Activity     Outcome Measure Help from another person eating meals?: None Help from another person taking care of personal grooming?: None Help from another person toileting, which includes using toliet, bedpan, or urinal?: None Help from another person bathing (including washing,  rinsing, drying)?: A Little (supv for safety) Help from another person to put on and taking off regular upper body clothing?: None Help from another person to put on and taking off regular lower body clothing?: None 6 Click Score: 23   End of Session Equipment Utilized During Treatment: Gait belt Nurse Communication: Mobility status  Activity Tolerance: Patient tolerated treatment well Patient left: Other (comment) (in w/c to be transported to CT)  OT Visit Diagnosis: Unsteadiness on feet (R26.81)                Time: 8546-2703 OT Time Calculation (min): 23 min Charges:  OT General Charges $OT Visit: 1 Visit OT Evaluation $OT Eval Low Complexity: 1 Low OT Treatments $Self Care/Home Management : 8-22 mins  Rejeana Brock, MS, OTR/L ascom [phone] 07/10/20, 5:56  PM

## 2020-07-10 NOTE — Consult Note (Addendum)
Neurology Consultation Reason for Consult: Brain lesion Referring Physician: Rito Ehrlich  CC: Headache  History is obtained from:[facility]  HPI: Rebecca Yang is a 55 y.o. female with a history of hypertension who has not taken her antihypertensives in a couple of years who presented with headache and confusion that has been present since the 24th.  She states that she woke up with it. She has perceived right sided weakness at baseline, but states this has bene unchanged for years. She denies new numbness or weakness. Her family member states that she has seemed a little confused.    LKW: 4/24 ICH score: 0  ROS: A 14 point ROS was performed and is negative except as noted in the HPI.  Past Medical History:  Diagnosis Date  . Hypertension   . Sarcoidosis      History reviewed. No pertinent family history.   Social History:  reports that she has been smoking. She has been smoking about 0.20 packs per day. She has never used smokeless tobacco. She reports current alcohol use. She reports that she does not use drugs.   Exam: Current vital signs: BP (!) 152/121 (BP Location: Left Arm)   Pulse 66   Temp 98.7 F (37.1 C)   Resp 18   Ht 5\' 5"  (1.651 m)   Wt 60 kg   LMP 01/23/2016 (Approximate)   SpO2 100%   BMI 22.01 kg/m  Vital signs in last 24 hours: Temp:  [98.2 F (36.8 C)-99.4 F (37.4 C)] 98.7 F (37.1 C) (04/30 0818) Pulse Rate:  [64-84] 66 (04/30 0818) Resp:  [16-18] 18 (04/30 0818) BP: (136-152)/(86-121) 152/121 (04/30 0818) SpO2:  [95 %-100 %] 100 % (04/30 0818)   Physical Exam  Constitutional: Appears well-developed and well-nourished.  Psych: Affect appropriate to situation Eyes: No scleral injection HENT: No OP obstruction MSK: no joint deformities.  Cardiovascular: Normal rate and regular rhythm.  Respiratory: Effort normal, non-labored breathing GI: Soft.  No distension. There is no tenderness.  Skin: WDI  Neuro: Mental Status: Patient is  awake, alert, oriented to person, place, month, year, and situation. Patient is able to give a clear and coherent history. No signs of aphasia or neglect Cranial Nerves: II: Visual Fields are full.  Right pupil is slightly larger than left, both are reactive III,IV, VI: EOMI without ptosis or diploplia.  V: Facial sensation is symmetric to temperature VII: Facial movement is symmetric.  VIII: hearing is intact to voice X: Uvula elevates symmetrically XI: Shoulder shrug is symmetric. XII: tongue is midline without atrophy or fasciculations.  Motor: Tone is normal. Bulk is normal. 5/5 strength was present in all four extremities.  Sensory: Sensation is symmetric to light touch and temperature in the arms and legs. Cerebellar: No clear ataxia      I have reviewed labs in epic and the results pertinent to this consultation are: Creatinine 0.8  I have reviewed the images obtained:  CT-hyperdense thalamic lesion MRI brain- mass lesion which  Impression: 56 year old female with a history of hypertension with what I suspect is a hemorrhagic stroke.  The one argument against this would be her relatively mild symptoms with this sized hemorrhage.  I agree that she needs repeat imaging, but in the short-term I would treat this as a hemorrhage with BP control.  She is not on any blood thinners at baseline and I would not start any at this time.  This far out from her onset of symptoms, I do not think that  aggressive IV drips are needed for BP control.  Would consider a as needed for greater than 160 SBP, but would start oral agents with a goal normotension.  Recommendations: 1) PT, OT, ST 2) BP control with goal of normotension 3) avoid anticoagulants and antithrombotics 4) agree with analgesia for pain control. 5) we will attempt Compazine for headache x1.   Ritta Slot, MD [facility] [phone]  If 7pm- 7am, please page neurology on call as listed in  [facility].

## 2020-07-11 LAB — LIPID PANEL
Cholesterol: 202 mg/dL — ABNORMAL HIGH (ref 0–200)
HDL: 61 mg/dL (ref >40)
LDL Cholesterol: 131 mg/dL — ABNORMAL HIGH (ref 0–99)
Total CHOL/HDL Ratio: 3.3 RATIO
Triglycerides: 52 mg/dL (ref <150)
VLDL: 10 mg/dL (ref 0–40)

## 2020-07-11 MED ORDER — LOSARTAN POTASSIUM 25 MG PO TABS
25.0000 mg | ORAL_TABLET | Freq: Every day | ORAL | Status: DC
  Administered 2020-07-11: 25 mg via ORAL
  Filled 2020-07-11: qty 1

## 2020-07-11 MED ORDER — AMLODIPINE BESYLATE 10 MG PO TABS: 10.0000 mg | ORAL_TABLET | Freq: Every day | ORAL | 2 refills | Status: DC

## 2020-07-11 MED ORDER — ATORVASTATIN CALCIUM 20 MG PO TABS: 20.0000 mg | ORAL_TABLET | Freq: Every day | ORAL | 2 refills | Status: DC

## 2020-07-11 MED ORDER — LOSARTAN POTASSIUM 25 MG PO TABS: 25.0000 mg | ORAL_TABLET | Freq: Every day | ORAL | 2 refills | Status: DC

## 2020-07-11 NOTE — Discharge Summary (Signed)
Physician Discharge Summary  Rebecca Yang BMW:[id number] DOB: [birth year] DOA: 07/08/2020  PCP: Patient, No Pcp Per (Inactive)  Admit date: 07/08/2020 Discharge date: 07/11/2020  Admitted From: home Disposition:  home  Recommendations for Outpatient Follow-up:  1. Follow up with PCP in 1-2 weeks 2. Please obtain BMP/CBC in one week 3. Please follow up on patient's blood pressure control.  Goal systolic BP of 140 or less, per neurology and neurosurgery. 4. Follow up on whether patient still having any headaches.  Home Health: PT  Equipment/Devices: none   Discharge Condition: Stable  CODE STATUS: Full  Diet recommendation: Heart Healthy      Discharge Diagnoses: Principal Problem:   Hemorrhagic cerebrovascular accident (CVA) (HCC) Active Problems:   Intracranial mass   Essential hypertension   Sarcoidosis    Summary of HPI and Hospital Course:  Rebecca Yang is a 55 year old female with past medical history significant for essential hypertension, sarcoidosis who presents to the emergency department for evaluation of persistent headache.  Patient reports onset 4 days prior which is now becoming intractable headache.  No headaches for 20 years.  Patient also reports some mild right upper extremity weakness associated with phono and photophobia with nausea.  Patient reports that her speech and vision are unaffected.  She initially thought her headache was related to high blood pressure because her diet has been poor recently has not checked her BP at home.  Patient further denies fever/chills, no weakness, no dizziness, no chest pain, no shortness of breath, no nausea/vomiting/diarrhea, no abdominal pain, no dysuria/frequency, no confusion.  No recent change in home medications, no recent illness, no hospitalizations, no travel or sick contacts.  In the ED, temperature 9 9.7 F, HR 87, RR 17, BP 151/103, SPO2 96% on room air.  Sodium 137, potassium 3.1, chloride 101, CO2 26,  glucose 136, BUN 20, creatinine 1.14.  AST 19, ALT 12.  Total bilirubin 0.8.  WBC 8.1, hemoglobin 13.6, platelets 195.  COVID-19 PCR negative.  Influenza A/B PCR negative.  Urinalysis unrevealing.  CT head without contrast with mass in the right thalamus and basal ganglia concerning for neoplasm, central areas of high density may represent acute hemorrhage or calcification, possible high-grade glioma.  MR brain with and without contrast with findings of 2.1 x 1.8 x 1.2 cm masslike abnormality right thalamus with associated regional mass-effect, 4 mm right to left shift concerning for evolving subacute hemorrhage/hemorrhagic infarct versus primary CNS neoplasm.  Medrol, Toradol, Reglan and normal saline.  EDP discussed with neurosurgery who requested inpatient mission for neurology consult.  Hospitalist service consulted for further evaluation and management.    Intractable headache secondary to right thalamic hemorrhagic CVA (vs less likely mass) Patient presenting to ED with 4-day history of progressive/intractable headache.  CT/MRI findings concerning for 2.1 x 1.8 x 1.2 cm masslike abnormality right thalamus with regional mass-effect concerning for subacute hemorrhage/hemorrhagic infarct versus primary CNS neoplasm.   Patient was seen by neurosurgery, Dr. Marcell Barlow on 4/29, with recommendations of repeat MR 4-6 weeks and PT/OT evaluation.  No acute need for surgical intervention at this time and if neoplasm biopsy would probably be necessary dependent on repeat scan. --Neurology consulted, appreciate assistance --BP Control: SBP>140 --antihypertensives as below  --Treated with IV Decadron during admission --PT recommends HH which was arranged by TOC --Supportive care for headache PRN --Repeat MRI in 4-6 weeks --Avoid anticoagulants and antithrombotics  Essential hypertension Not on home antihypertensive regimen.   4/30: BP's still elevated above goal, adjusting regimen --  amlodipine started  and increased 5 >>10 mg PO daily --losartan 25 mg PO daily added --IV labetalol PRN SBP>160 --monitor BP closely  Hyperlipidemia - noted on lipid panel for stroke work up. LDL 131. -- Atorvastatin 20 mg PO daily started  History of sarcoidosis Patient follows with [facility] pulmonology outpatient, Dr. Meredeth Ide; last seen per EMR 2015. --Recommend further outpatient follow-up    Discharge Instructions   Discharge Instructions    Call MD for:  difficulty breathing, headache or visual disturbances   Complete by: As directed    Call MD for:  extreme fatigue   Complete by: As directed    Call MD for:  persistant dizziness or light-headedness   Complete by: As directed    Call MD for:  persistant nausea and vomiting   Complete by: As directed    Call MD for:  severe uncontrolled pain   Complete by: As directed    Call MD for:  temperature >100.4   Complete by: As directed    Discharge instructions   Complete by: As directed    Our evaluation of your headache shows you probably had a small hemorrhagic stroke.   The most important thing for long term care is good control of your blood pressure.    Your goal for blood pressure is TOP number of 140 or less. Please monitor BP at home, if able.  Write down the numbers and bring this to your doctor appointments for follow up.  Please follow up with your primary care provider for blood pressure control.  We have started two medications to help control blood pressure - amlodipine and losartan.   Increase activity slowly   Complete by: As directed      Allergies as of 07/11/2020      Reactions   Penicillins Hives      Medication List    TAKE these medications   acetaminophen 325 MG tablet Commonly known as: TYLENOL Take 650 mg by mouth every 6 (six) hours as needed.   amLODipine 10 MG tablet Commonly known as: NORVASC Take 1 tablet (10 mg total) by mouth daily. Start taking on: Jul 12, 2020    diphenhydramine-acetaminophen 25-500 MG Tabs tablet Commonly known as: TYLENOL PM Take 1 tablet by mouth at bedtime as needed.   losartan 25 MG tablet Commonly known as: COZAAR Take 1 tablet (25 mg total) by mouth daily. Start taking on: Jul 12, 2020            Durable Medical Equipment  (From admission, onward)         Start     Ordered   07/09/20 1431  For home use only DME Other see comment  Once       Comments: Single point cane  Question:  Length of Need  Answer:  Lifetime   07/09/20 1431          Allergies  Allergen Reactions  . Penicillins Hives     If you experience worsening of your admission symptoms, develop shortness of breath, life threatening emergency, suicidal or homicidal thoughts you must seek medical attention immediately by calling 911 or calling your MD immediately  if symptoms less severe.    Please note   You were cared for by a hospitalist during your hospital stay. If you have any questions about your discharge medications or the care you received while you were in the hospital after you are discharged, you can call the unit and asked to speak with  the hospitalist on call if the hospitalist that took care of you is not available. Once you are discharged, your primary care physician will handle any further medical issues. Please note that NO REFILLS for any discharge medications will be authorized once you are discharged, as it is imperative that you return to your primary care physician (or establish a relationship with a primary care physician if you do not have one) for your aftercare needs so that they can reassess your need for medications and monitor your lab values.   Consultations:  Neurosurgery  Neurology    Procedures/Studies: CT ANGIO HEAD NECK W WO CM  Result Date: 07/10/2020 CLINICAL DATA:  Hypertensive patient with headache and confusion. Right thalamic hemorrhage. EXAM: CT ANGIOGRAPHY HEAD AND NECK TECHNIQUE: Multidetector  CT imaging of the head and neck was performed using the standard protocol during bolus administration of intravenous contrast. Multiplanar CT image reconstructions and MIPs were obtained to evaluate the vascular anatomy. Carotid stenosis measurements (when applicable) are obtained utilizing NASCET criteria, using the distal internal carotid diameter as the denominator. CONTRAST:  75mL OMNIPAQUE IOHEXOL 350 MG/ML SOLN COMPARISON:  CT and MRI study 07/08/2020 FINDINGS: CT HEAD FINDINGS Brain: Indistinct hemorrhage with surrounding low-density in the right thalamus has not increased and may be showing slight diminishing density, consistent with expected evolutionary change. Elsewhere, chronic small-vessel ischemic changes are present affecting the cerebral hemispheric white matter. No new insult. No mass, hydrocephalus or extra-axial collection. No intraventricular penetration. Vascular: No abnormal vascular finding by standard CT. Skull: Normal Sinuses: Clear Orbits: Normal Review of the MIP images confirms the above findings CTA NECK FINDINGS Aortic arch: Mild aortic atherosclerotic calcification. Branching pattern is normal without origin stenosis. Right carotid system: Common carotid artery widely patent to the bifurcation. No bifurcation plaque. No stenosis. Cervical ICA widely patent. Left carotid system: Common carotid artery widely patent to the bifurcation. No bifurcation plaque. No stenosis. Cervical ICA widely patent. Vertebral arteries: Both vertebral artery origins are widely patent. Both vertebral arteries appear normal through the cervical region to the foramen magnum. Skeleton: Ordinary cervical spondylosis. Other neck: No mass or lymphadenopathy. Upper chest: Emphysema and pulmonary scarring. No active process is evident. Review of the MIP images confirms the above findings CTA HEAD FINDINGS Anterior circulation: Both internal carotid arteries are patent through the skull base and siphon regions. No  siphon stenosis. The anterior and middle cerebral vessels are patent. No large or medium vessel occlusion. No correctable proximal stenosis. No aneurysm or vascular malformation. Posterior circulation: Both vertebral arteries widely patent to the basilar. No basilar stenosis. No high flow vascular abnormality seen in the right thalamus. Venous sinuses: Patent and normal. Anatomic variants: None significant. Review of the MIP images confirms the above findings IMPRESSION: Expected evolutionary changes slowly occurring in the right thalamic hemorrhagic process. No increased bleeding or mass effect. Small vessel changes elsewhere appear unchanged. Negative CT angiography. No identifiable vessel occlusion. No significant stenosis. No aneurysm or vascular malformation. No discernible abnormal vessels in the right thalamus. Electronically Signed   By: Paulina FusiMark  Shogry M.D.   On: 07/10/2020 16:40   CT Head Wo Contrast  Result Date: 07/08/2020 CLINICAL DATA:  Headache. EXAM: CT HEAD WITHOUT CONTRAST TECHNIQUE: Contiguous axial images were obtained from the base of the skull through the vertex without intravenous contrast. COMPARISON:  CT head 01/05/2015 FINDINGS: Brain: Mass lesion in the right thalamus and basal ganglia which is predominately low density. There is central high density within the mass which could be  calcification or hemorrhage. Mild mass-effect. Mild hydrocephalus is present. Hypodensity is seen throughout the cerebral white matter bilaterally with progression since 2016. Likely chronic microvascular ischemia. Vascular: Negative for hyperdense vessel Skull: Negative Sinuses/Orbits: Paranasal sinuses clear.  Negative orbit Other: None IMPRESSION: Mass in the right thalamus and basal ganglia concerning for neoplasm. Central areas of high density may represent acute hemorrhage or calcification. Possible high-grade glioma. Mild obstructive hydrocephalus. MRI brain without and with contrast recommended for  further evaluation. These results were called by telephone at the time of interpretation on 07/08/2020 at 4:30 pm to provider [facility] , who verbally acknowledged these results. Electronically Signed   By: Marlan Palau M.D.   On: 07/08/2020 16:31   MR Brain W and Wo Contrast  Result Date: 07/08/2020 CLINICAL DATA:  Initial evaluation for possible mass on prior CT. EXAM: MRI HEAD WITHOUT AND WITH CONTRAST TECHNIQUE: Multiplanar, multiecho pulse sequences of the brain and surrounding structures were obtained without and with intravenous contrast. CONTRAST:  6mL GADAVIST GADOBUTROL 1 MMOL/ML IV SOLN COMPARISON:  Prior CT from earlier the same day. FINDINGS: Brain: Cerebral volume within normal limits for age. Extensive patchy and confluent T2/FLAIR hyperintensity seen throughout the periventricular, deep, and subcortical white matter both cerebral hemispheres. Patchy involvement of the pons noted. Appearance most consistent with chronic microvascular ischemic disease, moderate to advanced in nature for age. Again seen is mass-like T2/FLAIR hyperintensity involving the right thalamus, corresponding with abnormality on prior head CT. Areas of central heterogeneous T2 hypointense, T1 hyperintense signal intensity with associated susceptibility artifact, consistent with hemorrhage/blood products. Extension to partially involve the adjacent right cerebral peduncle and midbrain. Following contrast administration, there is irregular somewhat ovoid enhancement within this region, measuring approximately 2.4 x 1.8 x 1.2 cm (series 19, image 89). Associated regional mass effect with localized right-to-left shift measuring up to approximately 4 mm. Finding is indeterminate, but given overall appearance, location, and morphology, an evolving subacute hemorrhagic infarct is favored. Possible neoplasm/mass is difficult to exclude, and would be the primary differential consideration. No other evidence for acute or  subacute ischemia. Gray-white matter differentiation otherwise maintained. No encephalomalacia to suggest chronic cortical infarction elsewhere within the brain. No other acute intracranial hemorrhage. Multiple additional scattered chronic micro hemorrhages noted clustered about the deep gray nuclei and brainstem, likely reflecting changes of chronic poorly controlled hypertension. This finding also suggest at the right thalamic abnormality may reflect a subacute hemorrhage. No other mass lesion or mass effect. No other abnormal enhancement. Ventricles remain within normal limits for size without significant hydrocephalus at this time. No visible intraventricular blood products. Pituitary gland and suprasellar region within normal limits. Midline structures intact. Vascular: Major intracranial vascular flow voids are maintained. Skull and upper cervical spine: Craniocervical junction within normal limits. Upper cervical spine normal. Bone marrow signal intensity within normal limits. No focal marrow replacing lesion. No scalp soft tissue abnormality. Sinuses/Orbits: Globes and orbital soft tissues demonstrate no acute finding. Paranasal sinuses are largely clear. No mastoid effusion. Inner ear structures grossly normal. Other: None. IMPRESSION: 1. 2.4 x 1.8 x 1.2 cm mass-like signal abnormality involving the right thalamus with associated regional mass effect and localized 4 mm of right-to-left shift. Finding is indeterminate, but given overall appearance, location and morphology, an evolving subacute hemorrhage/hemorrhagic infarct is favored. Possible primary CNS neoplasm/mass is difficult to exclude, and would be the primary differential consideration. A short interval follow-up MRI in perhaps 1 week suggested for further evaluation. 2. Multiple additional scattered chronic micro hemorrhages  clustered about the deep gray nuclei and brainstem, likely related to chronic poorly controlled hypertension. Finding also  suggest at the right thalamic abnormality could reflect a hemorrhage rather than neoplasm. 3. Underlying moderate to advanced chronic microvascular ischemic disease. Electronically Signed   By: Rise Mu M.D.   On: 07/08/2020 22:19       Subjective: Pt states feeling well.  No headaches overnight or this AM.  Feels a little unsteady with ambulation but states has improved a lot.  No other acute complaints.    Discharge Exam: Vitals:   07/11/20 1253 07/11/20 1256  BP: (!) 132/91 (!) 135/99  Pulse:  74  Resp:    Temp:    SpO2:  99%   Vitals:   07/11/20 0508 07/11/20 0826 07/11/20 1253 07/11/20 1256  BP: (!) 154/98 (!) 145/98 (!) 132/91 (!) 135/99  Pulse: (!) 56 65  74  Resp: 18 16    Temp: 97.6 F (36.4 C) 97.7 F (36.5 C)    TempSrc: Oral     SpO2: 99% 96%  99%  Weight:      Height:        General: Pt is alert, awake, not in acute distress Cardiovascular: RRR, S1/S2 +, no rubs, no gallops Respiratory: CTA bilaterally, no wheezing, no rhonchi Abdominal: Soft, NT, ND, bowel sounds + Extremities: no edema, no cyanosis    The results of significant diagnostics from this hospitalization (including imaging, microbiology, ancillary and laboratory) are listed below for reference.     Microbiology: Recent Results (from the past 240 hour(s))  Resp Panel by RT-PCR (Flu A&B, Covid) Nasopharyngeal Swab     Status: None   Collection Time: 07/08/20 11:55 PM   Specimen: Nasopharyngeal Swab; Nasopharyngeal(NP) swabs in vial transport medium  Result Value Ref Range Status   SARS Coronavirus 2 by RT PCR NEGATIVE NEGATIVE Final    Comment: (NOTE) SARS-CoV-2 target nucleic acids are NOT DETECTED.  The SARS-CoV-2 RNA is generally detectable in upper respiratory specimens during the acute phase of infection. The lowest concentration of SARS-CoV-2 viral copies this assay can detect is 138 copies/mL. A negative result does not preclude SARS-Cov-2 infection and should not be  used as the sole basis for treatment or other patient management decisions. A negative result may occur with  improper specimen collection/handling, submission of specimen other than nasopharyngeal swab, presence of viral mutation(s) within the areas targeted by this assay, and inadequate number of viral copies(<138 copies/mL). A negative result must be combined with clinical observations, patient history, and epidemiological information. The expected result is Negative.  Fact Sheet for Patients:  BloggerCourse.com  Fact Sheet for Healthcare Providers:  SeriousBroker.it  This test is no t yet approved or cleared by the [place] FDA and  has been authorized for detection and/or diagnosis of SARS-CoV-2 by FDA under an Emergency Use Authorization (EUA). This EUA will remain  in effect (meaning this test can be used) for the duration of the COVID-19 declaration under Section 564(b)(1) of the Act, 21 U.S.C.section 360bbb-3(b)(1), unless the authorization is terminated  or revoked sooner.       Influenza A by PCR NEGATIVE NEGATIVE Final   Influenza B by PCR NEGATIVE NEGATIVE Final    Comment: (NOTE) The Xpert Xpress SARS-CoV-2/FLU/RSV plus assay is intended as an aid in the diagnosis of influenza from Nasopharyngeal swab specimens and should not be used as a sole basis for treatment. Nasal washings and aspirates are unacceptable for Xpert Xpress SARS-CoV-2/FLU/RSV testing.  Fact  Sheet for Patients: BloggerCourse.com  Fact Sheet for Healthcare Providers: SeriousBroker.it  This test is not yet approved or cleared by the [place] FDA and has been authorized for detection and/or diagnosis of SARS-CoV-2 by FDA under an Emergency Use Authorization (EUA). This EUA will remain in effect (meaning this test can be used) for the duration of the COVID-19 declaration under Section  564(b)(1) of the Act, 21 U.S.C. section 360bbb-3(b)(1), unless the authorization is terminated or revoked.  Performed at [facility], [address] [place], [place] [address]      Labs: BNP (last 3 results) No results for input(s): BNP in the last 8760 hours. Basic Metabolic Panel: Recent Labs  Lab 07/08/20 1555 07/09/20 0646 07/10/20 0537  NA 137 139 137  K 3.1* 3.1* 4.5  CL 101 105 106  CO2 26 26 24   GLUCOSE 136* 95 160*  BUN 20 22* 17  CREATININE 1.14* 0.99 0.83  CALCIUM 9.4 8.8* 9.5  MG  --   --  2.0   Liver Function Tests: Recent Labs  Lab 07/08/20 1555 07/09/20 0646  AST 19 15  ALT 12 10  ALKPHOS 67 58  BILITOT 0.8 0.7  PROT 8.3* 7.0  ALBUMIN 4.2 3.5   No results for input(s): LIPASE, AMYLASE in the last 168 hours. No results for input(s): AMMONIA in the last 168 hours. CBC: Recent Labs  Lab 07/08/20 1555 07/09/20 0646 07/10/20 0537  WBC 8.1 5.5 5.4  HGB 13.6 11.9* 12.5  HCT 39.1 33.6* 35.7*  MCV 96.8 98.0 97.0  PLT 195 152 193   Cardiac Enzymes: No results for input(s): CKTOTAL, CKMB, CKMBINDEX, TROPONINI in the last 168 hours. BNP: Invalid input(s): POCBNP CBG: No results for input(s): GLUCAP in the last 168 hours. D-Dimer No results for input(s): DDIMER in the last 72 hours. Hgb A1c Recent Labs    07/10/20 0537  HGBA1C 5.2   Lipid Profile Recent Labs    07/11/20 0447  CHOL 202*  HDL 61  LDLCALC 131*  TRIG 52  CHOLHDL 3.3   Thyroid function studies No results for input(s): TSH, T4TOTAL, T3FREE, THYROIDAB in the last 72 hours.  Invalid input(s): FREET3 Anemia work up No results for input(s): VITAMINB12, FOLATE, FERRITIN, TIBC, IRON, RETICCTPCT in the last 72 hours. Urinalysis    Component Value Date/Time   COLORURINE YELLOW (A) 07/08/2020 2203   APPEARANCEUR CLEAR (A) 07/08/2020 2203   APPEARANCEUR Hazy 05/03/2013 1006   LABSPEC 1.014 07/08/2020 2203   LABSPEC 1.012 05/03/2013 1006   PHURINE 5.0 07/08/2020  2203   GLUCOSEU NEGATIVE 07/08/2020 2203   GLUCOSEU Negative 05/03/2013 1006   HGBUR NEGATIVE 07/08/2020 2203   BILIRUBINUR NEGATIVE 07/08/2020 2203   BILIRUBINUR Negative 05/03/2013 1006   KETONESUR NEGATIVE 07/08/2020 2203   PROTEINUR NEGATIVE 07/08/2020 2203   NITRITE NEGATIVE 07/08/2020 2203   LEUKOCYTESUR NEGATIVE 07/08/2020 2203   LEUKOCYTESUR Trace 05/03/2013 1006   Sepsis Labs Invalid input(s): PROCALCITONIN,  WBC,  LACTICIDVEN Microbiology Recent Results (from the past 240 hour(s))  Resp Panel by RT-PCR (Flu A&B, Covid) Nasopharyngeal Swab     Status: None   Collection Time: 07/08/20 11:55 PM   Specimen: Nasopharyngeal Swab; Nasopharyngeal(NP) swabs in vial transport medium  Result Value Ref Range Status   SARS Coronavirus 2 by RT PCR NEGATIVE NEGATIVE Final    Comment: (NOTE) SARS-CoV-2 target nucleic acids are NOT DETECTED.  The SARS-CoV-2 RNA is generally detectable in upper respiratory specimens during the acute phase of infection. The lowest concentration  of SARS-CoV-2 viral copies this assay can detect is 138 copies/mL. A negative result does not preclude SARS-Cov-2 infection and should not be used as the sole basis for treatment or other patient management decisions. A negative result may occur with  improper specimen collection/handling, submission of specimen other than nasopharyngeal swab, presence of viral mutation(s) within the areas targeted by this assay, and inadequate number of viral copies(<138 copies/mL). A negative result must be combined with clinical observations, patient history, and epidemiological information. The expected result is Negative.  Fact Sheet for Patients:  BloggerCourse.com  Fact Sheet for Healthcare Providers:  SeriousBroker.it  This test is no t yet approved or cleared by the [place] FDA and  has been authorized for detection and/or diagnosis of SARS-CoV-2 by FDA under  an Emergency Use Authorization (EUA). This EUA will remain  in effect (meaning this test can be used) for the duration of the COVID-19 declaration under Section 564(b)(1) of the Act, 21 U.S.C.section 360bbb-3(b)(1), unless the authorization is terminated  or revoked sooner.       Influenza A by PCR NEGATIVE NEGATIVE Final   Influenza B by PCR NEGATIVE NEGATIVE Final    Comment: (NOTE) The Xpert Xpress SARS-CoV-2/FLU/RSV plus assay is intended as an aid in the diagnosis of influenza from Nasopharyngeal swab specimens and should not be used as a sole basis for treatment. Nasal washings and aspirates are unacceptable for Xpert Xpress SARS-CoV-2/FLU/RSV testing.  Fact Sheet for Patients: BloggerCourse.com  Fact Sheet for Healthcare Providers: SeriousBroker.it  This test is not yet approved or cleared by the [place] FDA and has been authorized for detection and/or diagnosis of SARS-CoV-2 by FDA under an Emergency Use Authorization (EUA). This EUA will remain in effect (meaning this test can be used) for the duration of the COVID-19 declaration under Section 564(b)(1) of the Act, 21 U.S.C. section 360bbb-3(b)(1), unless the authorization is terminated or revoked.  Performed at [facility], [address] [place], [place] [address]      Time coordinating discharge: Over 30 minutes  SIGNED:   Pennie Banter, DO [facility] 07/11/2020, 1:10 PM   If 7PM-7AM, please contact night-coverage [email or web address]

## 2020-07-11 NOTE — Progress Notes (Signed)
Subjective: BP close to goal. No headache.   Exam: Vitals:   07/11/20 1253 07/11/20 1256  BP: (!) 132/91 (!) 135/99  Pulse:  74  Resp:    Temp:    SpO2:  99%   Gen: In bed, NAD Resp: non-labored breathing, no acute distress Abd: soft, nt  Neuro: MS: awake, alert, oriented.  HW:EXHBZ, VFF Motor: MAEW, no drift.  Sensory:intact to LT   Impression: 55 year old female with a history of hypertension with what I suspect is a hemorrhagic stroke(favor primary hemorrhage).  The one argument against this would be her relatively mild symptoms with this sized hemorrhage.  I agree that she needs repeat imaging, but in the short-term I would treat this as a hemorrhage with BP control.  She is not on any blood thinners at baseline and I would not start any at this time.  This far out from her onset of symptoms, I do not think that aggressive IV drips are needed for BP control.  Would consider a as needed for greater than 160 SBP, but would start oral agents with a goal normotension.  Recommendations: 1)BP control with goal of normotension 2) No further recommendations beyonfd this, plesae call with further questions or concerns.   Ritta Slot, MD [facility] [phone]  If 7pm- 7am, please page neurology on call as listed in AMION.

## 2020-07-11 NOTE — Plan of Care (Signed)
  Problem: Education: Goal: Knowledge of General Education information will improve Description: Including pain rating scale, medication(s)/side effects and non-pharmacologic comfort measures 07/11/2020 1311 by Pierce Crane, RN Outcome: Adequate for Discharge 07/11/2020 1311 by Pierce Crane, RN Outcome: Adequate for Discharge   Problem: Activity: Goal: Risk for activity intolerance will decrease 07/11/2020 1311 by Pierce Crane, RN Outcome: Adequate for Discharge 07/11/2020 1311 by Pierce Crane, RN Outcome: Adequate for Discharge   Problem: Coping: Goal: Level of anxiety will decrease 07/11/2020 1311 by Pierce Crane, RN Outcome: Adequate for Discharge 07/11/2020 1311 by Pierce Crane, RN Outcome: Adequate for Discharge   Problem: Elimination: Goal: Will not experience complications related to bowel motility 07/11/2020 1311 by Pierce Crane, RN Outcome: Adequate for Discharge 07/11/2020 1311 by Pierce Crane, RN Outcome: Adequate for Discharge   Problem: Skin Integrity: Goal: Risk for impaired skin integrity will decrease 07/11/2020 1311 by Pierce Crane, RN Outcome: Adequate for Discharge 07/11/2020 1311 by Pierce Crane, RN Outcome: Adequate for Discharge   Problem: Education: Goal: Knowledge of disease or condition will improve 07/11/2020 1311 by Pierce Crane, RN Outcome: Adequate for Discharge 07/11/2020 1311 by Pierce Crane, RN Outcome: Adequate for Discharge Goal: Knowledge of secondary prevention will improve 07/11/2020 1311 by Pierce Crane, RN Outcome: Adequate for Discharge 07/11/2020 1311 by Pierce Crane, RN Outcome: Adequate for Discharge   Problem: Spontaneous Subarachnoid Hemorrhage Tissue Perfusion: Goal: Complications of Spontaneous Subarachnoid Hemorrhage will be minimized 07/11/2020 1311 by Pierce Crane, RN Outcome: Adequate for Discharge 07/11/2020 1311 by Pierce Crane, RN Outcome: Adequate for Discharge    Problem: Acute Rehab PT Goals(only PT should resolve) Goal: Patient Will Transfer Sit To/From Stand Outcome: Adequate for Discharge Goal: Pt Will Ambulate Outcome: Adequate for Discharge Goal: Pt Will Go Up/Down Stairs Outcome: Adequate for Discharge Goal: Pt/caregiver will Perform Home Exercise Program Outcome: Adequate for Discharge

## 2020-07-16 ENCOUNTER — Other Ambulatory Visit: Payer: Self-pay | Admitting: Neurosurgery

## 2020-07-16 DIAGNOSIS — G939 Disorder of brain, unspecified: Secondary | ICD-10-CM

## 2020-08-13 ENCOUNTER — Ambulatory Visit: Payer: Self-pay

## 2021-07-01 ENCOUNTER — Emergency Department: Payer: 59

## 2021-07-01 ENCOUNTER — Other Ambulatory Visit: Payer: Self-pay

## 2021-07-01 ENCOUNTER — Emergency Department
Admission: EM | Admit: 2021-07-01 | Discharge: 2021-07-01 | Disposition: A | Discharge status: Home / Self Care | Payer: 59 | Attending: Emergency Medicine | Admitting: Emergency Medicine

## 2021-07-01 DIAGNOSIS — R200 Anesthesia of skin: Secondary | ICD-10-CM | POA: Diagnosis present

## 2021-07-01 NOTE — ED Triage Notes (Addendum)
Pt to ED via POV from home. Pt reports left hand numbness x1 month that has been constant. Pt reports she believes its because she hasn't been taking her BP medication for several months. Pt denies HA, blurry vision, CP or SOB.  ?

## 2021-07-01 NOTE — ED Provider Notes (Signed)
? ?[facility] ?Provider Note ? ? ? Event Date/Time  ? First MD Initiated Contact with Patient 07/01/21 [phone]   ?  (approximate) ? ? ?History  ? ?Numbness (Left hand x1 month ) ? ? ?HPI ? ?Rebecca Yang is a 56 y.o. female   presents to the ED with complaint of left hand numbness since the first of the year.  Patient states that there has been no injury. ? ?  ? ? ?Physical Exam  ? ?Triage Vital Signs: ?ED Triage Vitals  ?Enc Vitals Group  ?   BP 07/01/21 0906 (!) 131/93  ?   Pulse Rate 07/01/21 0906 79  ?   Resp 07/01/21 0906 18  ?   Temp 07/01/21 0906 98.6 ?F (37 ?C)  ?   Temp Source 07/01/21 0906 Oral  ?   SpO2 07/01/21 0906 93 %  ?   Weight 07/01/21 0907 130 lb (59 kg)  ?   Height 07/01/21 0907 5\' 4"  (1.626 m)  ?   Head Circumference --   ?   Peak Flow --   ?   Pain Score 07/01/21 0907 0  ?   Pain Loc --   ?   Pain Edu? --   ?   Excl. in GC? --   ? ? ?Most recent vital signs: ?Vitals:  ? 07/01/21 0926 07/01/21 1133  ?BP: 122/87 (!) 143/66  ?Pulse: 77 60  ?Resp: 16 18  ?Temp:    ?SpO2: 98% 100%  ? ? ? ?General: Awake, no distress.  ?CV:  Good peripheral perfusion.  Heart regular rate and rhythm. ?Resp:  Normal effort.  Lungs are clear bilaterally. ?Abd:  No distention.  ?Other:  On examination of the left hand there is no gross deformity, edema, discoloration or open wounds.  Patient is able to move digits without difficulty.  Motor or sensory function intact.  Capillary refills less than 3 seconds.  Equal grip bilaterally. ? ? ?ED Results / Procedures / Treatments  ? ?Labs ?(all labs ordered are listed, but only abnormal results are displayed) ?Labs Reviewed - No data to display ? ? ? ?RADIOLOGY ? ?Left hand next images were reviewed independently of the radiologist and no fractures or foreign bodies were noted.  Radiology report is negative. ? ? ?PROCEDURES: ? ?Critical Care performed:  ? ?Procedures ? ? ?MEDICATIONS ORDERED IN ED: ?Medications - No data to display ? ? ?IMPRESSION / MDM /  ASSESSMENT AND PLAN / ED COURSE  ?I reviewed the triage vital signs and the nursing notes. ? ? ?Differential diagnosis includes, but is not limited to, localized numbness left hand, carpal tunnel syndrome. ? ?56 year old female presents to the ED with complaint of numbness to her left hand for greater than 1 month.  Patient denies any injury prior to this.  Range of motion is unrestricted.  Physical exam was benign.  X-rays were negative for acute injury.  I discussed with patient the need for further evaluation and gave her the information for the neurology clinic at [facility].  Patient is aware that she will need to make the appointment herself.  She agrees with this plan. ? ? ?FINAL CLINICAL IMPRESSION(S) / ED DIAGNOSES  ? ?Final diagnoses:  ?Numbness of left hand  ? ? ? ?Rx / DC Orders  ? ?ED Discharge Orders   ? ? None  ? ?  ? ? ? ?Note:  This document was prepared using Dragon voice recognition software and may include unintentional  dictation errors. ?  ?Tommi Rumps, PA-C ?07/01/21 1447 ? ?  ?Arnaldo Natal, MD ?07/01/21 1527 ? ?

## 2021-07-01 NOTE — Discharge Instructions (Signed)
Follow-up with your primary care provider or Dr. Melrose Nakayama who is located in [facility]  He is a specialist that deals with numbness and nerve issues.  X-rays today are negative for any bone injury or abnormality.  The fact that your hand has been numb for a long period of time should be looked into with continued testing. ?

## 2021-07-01 NOTE — ED Notes (Signed)
Patient states she takes Norvasc and can't get a refill until May 17. ?

## 2021-09-24 IMAGING — CT CT ANGIO HEAD-NECK (W OR W/O PERF)
2 of 10 series · 7 of 35 positions shown · IV contrast (omnipaque)
Comparison: CT and MRI study 07/08/2020

CLINICAL DATA: Hypertensive patient with headache and confusion.
Right thalamic hemorrhage.

EXAM:
CT ANGIOGRAPHY HEAD AND NECK
TECHNIQUE: Multidetector CT imaging of the head and neck was performed using
the standard protocol during bolus administration of intravenous
contrast. Multiplanar CT image reconstructions and MIPs were
obtained to evaluate the vascular anatomy. Carotid stenosis
measurements (when applicable) are obtained utilizing NASCET
criteria, using the distal internal carotid diameter as the
denominator.
CONTRAST:  75mL OMNIPAQUE IOHEXOL 350 MG/ML SOLN

[Series 10: ax thin · axial · 0.39mm/px · z∈[+150,+375]mm · 6 of 320 slices shown]
[im 46/320  soft-tissue]
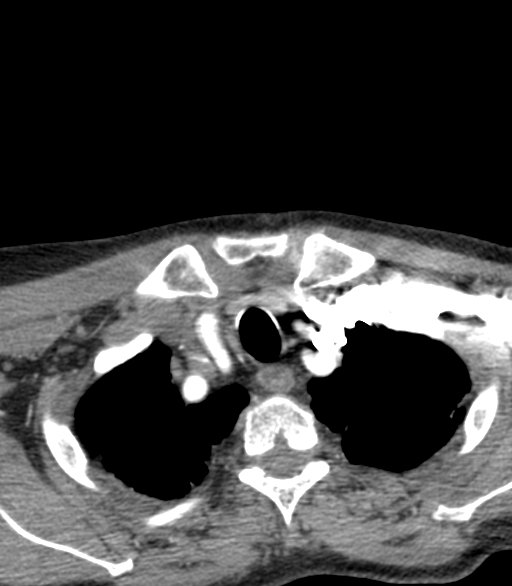
[im 92/320  bone]
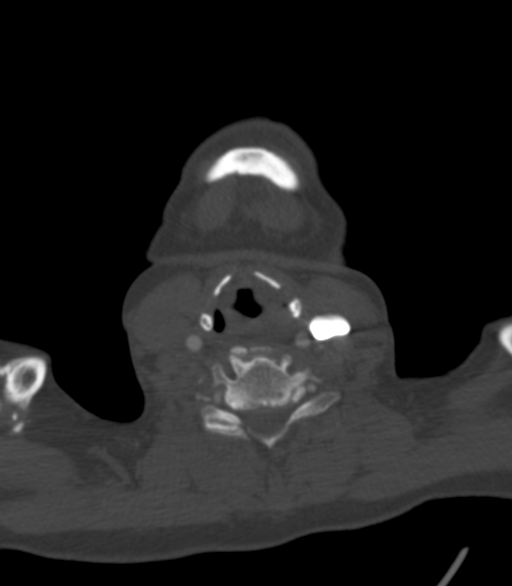
[im 137/320  soft-tissue]
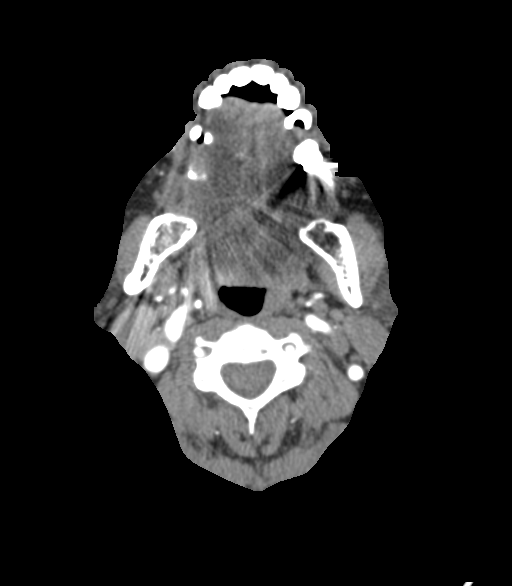
[im 183/320  bone]
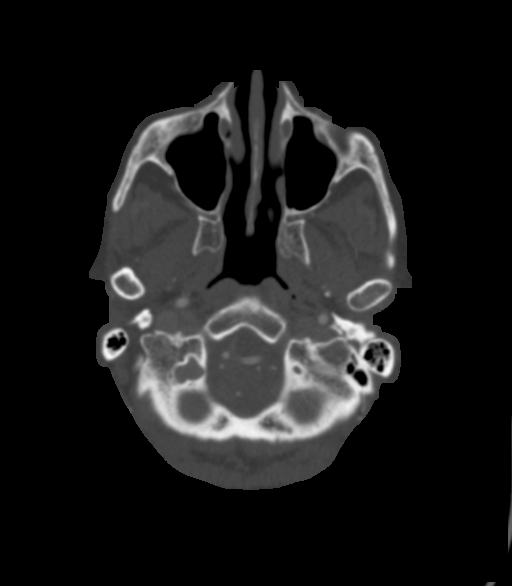
[im 228/320  soft-tissue]
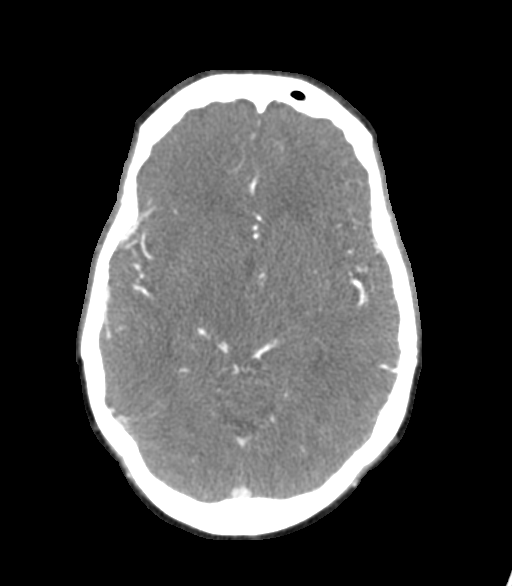
[im 274/320  bone]
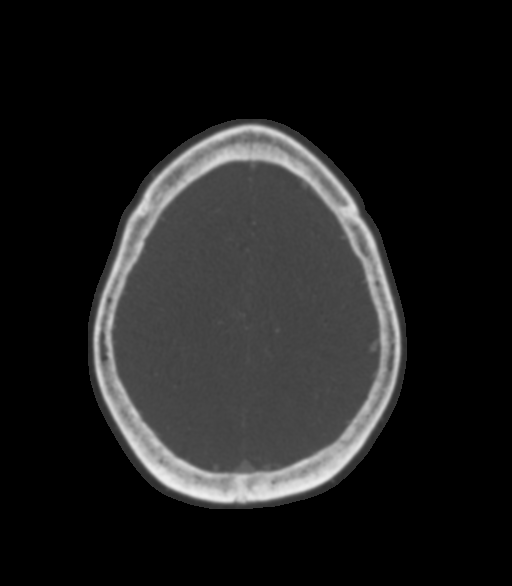

[Series 12: sagittal thin · sagittal · 0.42mm/px · 1 of 176 slices shown]
[im 65/176  soft-tissue]
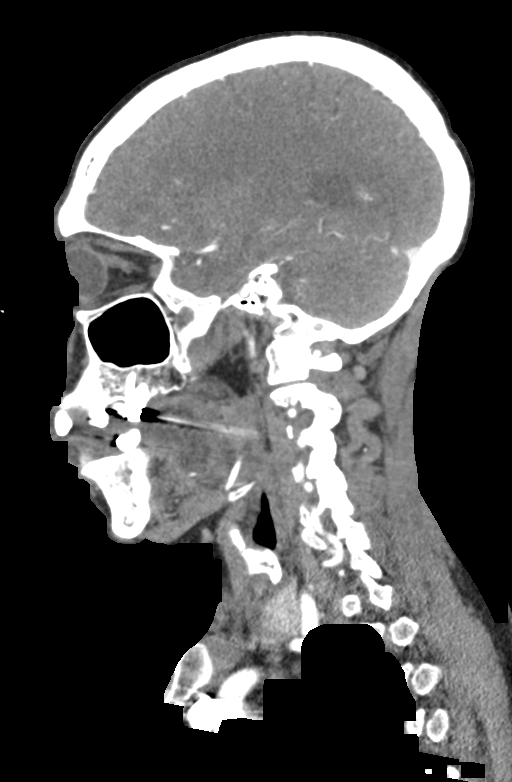

[7 of 35 positions shown; findings below may reference images not displayed]

FINDINGS: CT HEAD FINDINGS

Brain: Indistinct hemorrhage with surrounding low-density in the
right thalamus has not increased and may be showing slight
diminishing density, consistent with expected evolutionary change.
Elsewhere, chronic small-vessel ischemic changes are present
affecting the cerebral hemispheric white matter. No new insult. No
mass, hydrocephalus or extra-axial collection. No intraventricular
penetration.

Vascular: No abnormal vascular finding by standard CT.

Skull: Normal

Sinuses: Clear

Orbits: Normal

Review of the MIP images confirms the above findings

CTA NECK FINDINGS

Aortic arch: Mild aortic atherosclerotic calcification. Branching
pattern is normal without origin stenosis.

Right carotid system: Common carotid artery widely patent to the
bifurcation. No bifurcation plaque. No stenosis. Cervical ICA widely
patent.

Left carotid system: Common carotid artery widely patent to the
bifurcation. No bifurcation plaque. No stenosis. Cervical ICA widely
patent.

Vertebral arteries: Both vertebral artery origins are widely patent.
Both vertebral arteries appear normal through the cervical region to
the foramen magnum.

Skeleton: Ordinary cervical spondylosis.

Other neck: No mass or lymphadenopathy.

Upper chest: Emphysema and pulmonary scarring. No active process is
evident.

Review of the MIP images confirms the above findings

CTA HEAD FINDINGS

Anterior circulation: Both internal carotid arteries are patent
through the skull base and siphon regions. No siphon stenosis. The
anterior and middle cerebral vessels are patent. No large or medium
vessel occlusion. No correctable proximal stenosis. No aneurysm or
vascular malformation.

Posterior circulation: Both vertebral arteries widely patent to the
basilar. No basilar stenosis. No high flow vascular abnormality seen
in the right thalamus.

Venous sinuses: Patent and normal.

Anatomic variants: None significant.

Review of the MIP images confirms the above findings
IMPRESSION: Expected evolutionary changes slowly occurring in the right thalamic
hemorrhagic process. No increased bleeding or mass effect. Small
vessel changes elsewhere appear unchanged.

Negative CT angiography. No identifiable vessel occlusion. No
significant stenosis. No aneurysm or vascular malformation. No
discernible abnormal vessels in the right thalamus.

## 2021-12-30 ENCOUNTER — Ambulatory Visit: Payer: Self-pay | Admitting: Physician Assistant

## 2021-12-30 DIAGNOSIS — I10 Essential (primary) hypertension: Secondary | ICD-10-CM

## 2021-12-30 DIAGNOSIS — D869 Sarcoidosis, unspecified: Secondary | ICD-10-CM

## 2021-12-30 DIAGNOSIS — I619 Nontraumatic intracerebral hemorrhage, unspecified: Secondary | ICD-10-CM

## 2021-12-30 NOTE — Progress Notes (Deleted)
New patient visit   Patient: Rebecca Yang   DOB: [birth year]   56 y.o. Female  MRN: [id number] Visit Date: 12/30/2021  Today's healthcare provider: Mardene Speak, PA-C   No chief complaint on file.  Subjective    Rebecca Yang is a 56 y.o. female who presents today as a new patient to establish care.  HPI  ***  Past Medical History:  Diagnosis Date  . Hypertension   . Sarcoidosis    Past Surgical History:  Procedure Laterality Date  . BREAST SURGERY  2000   lumpectomy  . HAND SURGERY    . SKIN SURGERY     No family status information on file.   No family history on file. Social History   Socioeconomic History  . Marital status: Single    Spouse name: Not on file  . Number of children: Not on file  . Years of education: Not on file  . Highest education level: Not on file  Occupational History  . Not on file  Tobacco Use  . Smoking status: Every Day    Packs/day: 0.20    Types: Cigarettes  . Smokeless tobacco: Never  Substance and Sexual Activity  . Alcohol use: Yes    Comment: occas, last drink 07/04/20   . Drug use: Never  . Sexual activity: Not on file  Other Topics Concern  . Not on file  Social History Narrative  . Not on file   Social Determinants of Health   Financial Resource Strain: Not on file  Food Insecurity: Not on file  Transportation Needs: Not on file  Physical Activity: Not on file  Stress: Not on file  Social Connections: Not on file   Outpatient Medications Prior to Visit  Medication Sig  . acetaminophen (TYLENOL) 325 MG tablet Take 650 mg by mouth every 6 (six) hours as needed.  Marland Kitchen amLODipine (NORVASC) 10 MG tablet Take 1 tablet (10 mg total) by mouth daily.  Marland Kitchen atorvastatin (LIPITOR) 20 MG tablet Take 1 tablet (20 mg total) by mouth daily.  . diphenhydramine-acetaminophen (TYLENOL PM) 25-500 MG TABS tablet Take 1 tablet by mouth at bedtime as needed.  Marland Kitchen losartan (COZAAR) 25 MG tablet Take 1 tablet (25 mg total) by mouth  daily.   No facility-administered medications prior to visit.   Allergies  Allergen Reactions  . Penicillins Hives     There is no immunization history on file for this patient.  Health Maintenance  Topic Date Due  . COVID-19 Vaccine (1) Never done  . Hepatitis C Screening  Never done  . TETANUS/TDAP  Never done  . PAP SMEAR-Modifier  Never done  . COLONOSCOPY (Pts 45-31yrs Insurance coverage will need to be confirmed)  Never done  . MAMMOGRAM  Never done  . Zoster Vaccines- Shingrix (1 of 2) Never done  . INFLUENZA VACCINE  Never done  . HIV Screening  Completed  . HPV VACCINES  Aged Out    Patient Care Team: Center, [facility] as PCP - General (General Practice)  Review of Systems  All other systems reviewed and are negative.  {Labs  Heme  Chem  Endocrine  Serology  Results Review (optional):23779}   Objective    LMP 01/23/2016 (Approximate)  {Show previous vital signs (optional):23777}  Physical Exam ***  Depression Screen     No data to display         No results found for any visits on 12/30/21.  Assessment & Plan     ***  No follow-ups on file.     {provider attestation***:1}   Debera Lat, Cordelia Poche  [facility] [phone] (phone) [phone] (fax)  [facility]

## 2022-01-17 ENCOUNTER — Ambulatory Visit: Payer: Commercial Managed Care - HMO | Admitting: Physician Assistant

## 2022-01-17 ENCOUNTER — Encounter: Payer: Self-pay | Admitting: Physician Assistant

## 2022-01-17 VITALS — BP 146/96 | HR 79 | Resp 16 | Ht 64.0 in | Wt 141.0 lb

## 2022-01-17 DIAGNOSIS — R739 Hyperglycemia, unspecified: Secondary | ICD-10-CM | POA: Diagnosis not present

## 2022-01-17 DIAGNOSIS — R0989 Other specified symptoms and signs involving the circulatory and respiratory systems: Secondary | ICD-10-CM

## 2022-01-17 DIAGNOSIS — F17200 Nicotine dependence, unspecified, uncomplicated: Secondary | ICD-10-CM

## 2022-01-17 DIAGNOSIS — E1159 Type 2 diabetes mellitus with other circulatory complications: Secondary | ICD-10-CM | POA: Diagnosis not present

## 2022-01-17 DIAGNOSIS — D508 Other iron deficiency anemias: Secondary | ICD-10-CM

## 2022-01-17 DIAGNOSIS — I152 Hypertension secondary to endocrine disorders: Secondary | ICD-10-CM

## 2022-01-17 DIAGNOSIS — R200 Anesthesia of skin: Secondary | ICD-10-CM

## 2022-01-17 DIAGNOSIS — R202 Paresthesia of skin: Secondary | ICD-10-CM

## 2022-01-17 DIAGNOSIS — E785 Hyperlipidemia, unspecified: Secondary | ICD-10-CM

## 2022-01-17 MED ORDER — AMLODIPINE BESYLATE 10 MG PO TABS: 10.0000 mg | ORAL_TABLET | Freq: Every day | ORAL | 2 refills | Status: DC

## 2022-01-17 MED ORDER — ATORVASTATIN CALCIUM 20 MG PO TABS: 20.0000 mg | ORAL_TABLET | Freq: Every day | ORAL | 2 refills | Status: DC

## 2022-01-17 NOTE — Progress Notes (Unsigned)
I,April Miller,acting as a Neurosurgeon for [employer], PA-C.,have documented all relevant documentation on the behalf of Rebecca Lat, PA-C,as directed by  [employer], PA-C while in the presence of [employer], PA-C.  New patient visit   Patient: Rebecca Yang   DOB: 1966/01/31   56 y.o. Female  MRN: [id number] Visit Date: 01/17/2022  Today's healthcare provider: Debera Lat, PA-C   Chief Complaint  Patient presents with   Establish Care   Subjective    Rebecca Yang is a 56 y.o. female who presents today as a new patient to establish care.  HPI   HTN Pt has been having problems with her insurance and has been out of her medications for a year. Per chart review, amlodipine 10mg  was last dispensed on 07/11/20 #90 and losartan 25 mg #90 Pt requested to Rx or refill her medications for BP  HLD She has been taking atorvastatin 20mg , her last refill was on 07/11/21  Per chart review, pt was seen by Neurology at [facility] on 07/14/21 for numbness and tingling, abnormal brain MRI from 2022/possible brain mass. She was referred to neurosurgery for abnormal MRI and needed to repeat brain MRI. Started on 81mg  aspirin for stroke prevention and lisinopril 2/5 mg daily. CTA was negative for carotid artery stenosis, positive for old small infarcts. Pt seems not to be scheduled with Neurosurgery.   Current day smoker  Past Medical History:  Diagnosis Date   Hypertension    Sarcoidosis    Stroke [facility])    Past Surgical History:  Procedure Laterality Date   BREAST SURGERY  2000   lumpectomy   GALLBLADDER SURGERY     HAND SURGERY     SKIN SURGERY     Family Status  Relation Name Status   Brother  (Not Specified)   Son  (Not Specified)   MGM  (Not Specified)   Family History  Problem Relation Age of Onset   Gout Brother    Asthma Son    Gout Maternal Grandmother    Social History   Socioeconomic History   Marital status: Single    Spouse name: Not on file   Number of  children: 3   Years of education: Not on file   Highest education level: Not on file  Occupational History   Not on file  Tobacco Use   Smoking status: Every Day    Packs/day: 0.20    Types: Cigarettes   Smokeless tobacco: Never  Vaping Use   Vaping Use: Never used  Substance and Sexual Activity   Alcohol use: Not Currently    Comment: occas, last drink 07/04/20    Drug use: Not Currently   Sexual activity: Not on file  Other Topics Concern   Not on file  Social History Narrative   Not on file   Social Determinants of Health   Financial Resource Strain: Not on file  Food Insecurity: Not on file  Transportation Needs: Not on file  Physical Activity: Not on file  Stress: Not on file  Social Connections: Not on file   Outpatient Medications Prior to Visit  Medication Sig   acetaminophen (TYLENOL) 325 MG tablet Take 650 mg by mouth every 6 (six) hours as needed.   amLODipine (NORVASC) 10 MG tablet Take 1 tablet (10 mg total) by mouth daily.   atorvastatin (LIPITOR) 20 MG tablet Take 1 tablet (20 mg total) by mouth daily.   diphenhydramine-acetaminophen (TYLENOL PM) 25-500 MG TABS tablet Take 1  tablet by mouth at bedtime as needed.   losartan (COZAAR) 25 MG tablet Take 1 tablet (25 mg total) by mouth daily.   No facility-administered medications prior to visit.   Allergies  Allergen Reactions   Penicillins Hives     There is no immunization history on file for this patient.  Health Maintenance  Topic Date Due   COVID-19 Vaccine (1) Never done   Hepatitis C Screening  Never done   TETANUS/TDAP  Never done   PAP SMEAR-Modifier  Never done   COLONOSCOPY (Pts 45-73yrs Insurance coverage will need to be confirmed)  Never done   MAMMOGRAM  Never done   Zoster Vaccines- Shingrix (1 of 2) Never done   INFLUENZA VACCINE  Never done   HIV Screening  Completed   HPV VACCINES  Aged Out    Patient Care Team: Center, [facility] as PCP - General (General  Practice)  Review of Systems  Endocrine: Positive for cold intolerance and polyphagia.  Genitourinary:  Positive for enuresis and urgency.  Musculoskeletal:  Positive for gait problem and myalgias.  Neurological:  Positive for weakness and numbness.  All other systems reviewed and are negative.      Objective    BP (!) 146/96 (BP Location: Right Arm, Patient Position: Sitting, Cuff Size: Normal)   Pulse 79   Resp 16   Ht 5\' 4"  (1.626 m)   Wt 141 lb (64 kg)   LMP 01/23/2016 (Approximate)   SpO2 96%   BMI 24.20 kg/m    Physical Exam Vitals reviewed.  Constitutional:      General: She is not in acute distress.    Appearance: Normal appearance. She is well-developed. She is not diaphoretic.  HENT:     Head: Normocephalic and atraumatic.  Eyes:     General: No scleral icterus.    Conjunctiva/sclera: Conjunctivae normal.  Neck:     Thyroid: No thyromegaly.  Cardiovascular:     Rate and Rhythm: Normal rate and regular rhythm.     Pulses: Normal pulses.     Heart sounds: Normal heart sounds. No murmur heard. Pulmonary:     Effort: Pulmonary effort is normal. No respiratory distress.     Breath sounds: Rales present. No wheezing or rhonchi.  Musculoskeletal:     Cervical back: Neck supple.     Right lower leg: No edema.     Left lower leg: No edema.  Lymphadenopathy:     Cervical: No cervical adenopathy.  Skin:    General: Skin is warm and dry.     Findings: No rash.  Neurological:     Mental Status: She is alert and oriented to person, place, and time. Mental status is at baseline.  Psychiatric:        Mood and Affect: Mood normal.        Behavior: Behavior normal.     Depression Screen    01/17/2022    2:16 PM  PHQ 2/9 Scores  PHQ - 2 Score 0  PHQ- 9 Score 1   No results found for any visits on 01/17/22.  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  Dispense: 30 tablet; Refill: 2 MRI from 07/08/21 showed scattered chronic micro hemorrhages ,  likely related to poorly controlled hypertension.  Questioned pt's compliance She was Rxed lisinopril by Neurology. Unclear if she has been taking lisinopril  Numbness/tingling/weakness Could be due to abnormal mass found on MRI in 2022 Has been managed by Neurology. See HPI MRI from 07/08/21 showed hemorrhage/hemorrhagic infarct vs neoplasm. Was advised to repeat MRI in a week. No repeated MRI was found No Neurosurgery was scheduled Questioning pt's compliance Pt did not see Neurology after her initial appt?  Other iron deficiency anemia Last labs from 07/11/20 showed low hct level and hemoglobin of 12.5 - Basic metabolic panel - CBC with Differential/Platelet  Hyperlipidemia, unspecified hyperlipidemia type Chronic and stable  Continue statin. Refill was placed. Last lipids were elevated: cholesterol 202 and LDL cholesterol 131 - Lipid panel - atorvastatin (LIPITOR) 20 MG tablet; Take 1 tablet (20 mg total) by mouth daily.  Dispense: 30 tablet; Refill: 2 Encouraged healthy diet and exercise Will FU  Abnormal lung sounds/on PE Current day smoker Hx of sarcoidosis -CXR was ordered.  Will reassess at her FU   Current day smoker Smoking cessation advised Will revisit at her next appt  Hyperglycemia Last labs from 07/11/20 showed elevated serum glucose - Basic metabolic panel - Hemoglobin A1c FU in 6 wks, the rest of her problems should be addressed at FU. The most urgent problems for her were addressed during the visit   The patient was advised to call back or seek an in-person evaluation if the symptoms worsen or if the condition fails to improve as anticipated.  I discussed the assessment and treatment plan with the patient. The patient was  provided an opportunity to ask questions and all were answered. The patient agreed with the plan and demonstrated an understanding of the instructions.  The entirety of the information documented in the History of Present Illness, Review of Systems and Physical Exam were personally obtained by me. Portions of this information were initially documented by the CMA and reviewed by me for thoroughness and accuracy.  Portions of this note were created using dictation software and may contain typographical errors.     Rebecca Lat, PA-C  [facility] [phone] (phone) [phone] (fax)  [facility]

## 2022-01-18 ENCOUNTER — Ambulatory Visit
Admission: RE | Admit: 2022-01-18 | Discharge: 2022-01-18 | Disposition: A | Discharge status: Home / Self Care | Payer: Self-pay | Attending: Physician Assistant | Admitting: Physician Assistant

## 2022-01-18 ENCOUNTER — Telehealth: Payer: Self-pay | Admitting: *Deleted

## 2022-01-18 ENCOUNTER — Ambulatory Visit
Admission: RE | Admit: 2022-01-18 | Discharge: 2022-01-18 | Disposition: A | Discharge status: Home / Self Care | Payer: Self-pay | Source: Ambulatory Visit | Attending: Physician Assistant | Admitting: Physician Assistant

## 2022-01-18 DIAGNOSIS — R0989 Other specified symptoms and signs involving the circulatory and respiratory systems: Secondary | ICD-10-CM | POA: Insufficient documentation

## 2022-01-18 DIAGNOSIS — F17200 Nicotine dependence, unspecified, uncomplicated: Secondary | ICD-10-CM

## 2022-01-18 NOTE — Telephone Encounter (Signed)
Copied from CRM [id number]. Topic: General - Other >> Jan 18, 2022  1:11 PM Ja-Kwan M wrote: Reason for CRM: Kim with Outpatient Imaging stated pt is there but they do not have an order for x-ray. Kim requests that the x-ray order be placed.

## 2022-01-19 LAB — BASIC METABOLIC PANEL
BUN/Creatinine Ratio: 14 (ref 9–23)
BUN: 16 mg/dL (ref 6–24)
CO2: 22 mmol/L (ref 20–29)
Calcium: 9.7 mg/dL (ref 8.7–10.2)
Chloride: 105 mmol/L (ref 96–106)
Creatinine, Ser: 1.12 mg/dL — ABNORMAL HIGH (ref 0.57–1.00)
Glucose: 97 mg/dL (ref 70–99)
Potassium: 4 mmol/L (ref 3.5–5.2)
Sodium: 142 mmol/L (ref 134–144)
eGFR: 57 mL/min/{1.73_m2} — ABNORMAL LOW (ref >59)

## 2022-01-19 LAB — CBC WITH DIFFERENTIAL/PLATELET
Basophils Absolute: 0 10*3/uL (ref 0.0–0.2)
Basos: 1 %
EOS (ABSOLUTE): 0.2 10*3/uL (ref 0.0–0.4)
Eos: 3 %
Hematocrit: 38 % (ref 34.0–46.6)
Hemoglobin: 12.9 g/dL (ref 11.1–15.9)
Immature Grans (Abs): 0 10*3/uL (ref 0.0–0.1)
Immature Granulocytes: 1 %
Lymphocytes Absolute: 2.8 10*3/uL (ref 0.7–3.1)
Lymphs: 43 %
MCH: 31.1 pg (ref 26.6–33.0)
MCHC: 33.9 g/dL (ref 31.5–35.7)
MCV: 92 fL (ref 79–97)
Monocytes Absolute: 0.7 10*3/uL (ref 0.1–0.9)
Monocytes: 11 %
Neutrophils Absolute: 2.5 10*3/uL (ref 1.4–7.0)
Neutrophils: 41 %
Platelets: 191 10*3/uL (ref 150–450)
RBC: 4.15 x10E6/uL (ref 3.77–5.28)
RDW: 12.3 % (ref 11.7–15.4)
WBC: 6.2 10*3/uL (ref 3.4–10.8)

## 2022-01-19 LAB — LIPID PANEL
Chol/HDL Ratio: 3 ratio (ref 0.0–4.4)
Cholesterol, Total: 213 mg/dL — ABNORMAL HIGH (ref 100–199)
HDL: 70 mg/dL (ref >39)
LDL Chol Calc (NIH): 126 mg/dL — ABNORMAL HIGH (ref 0–99)
Triglycerides: 94 mg/dL (ref 0–149)
VLDL Cholesterol Cal: 17 mg/dL (ref 5–40)

## 2022-01-19 LAB — HEMOGLOBIN A1C
Est. average glucose Bld gHb Est-mCnc: 111 mg/dL
Hgb A1c MFr Bld: 5.5 % (ref 4.8–5.6)

## 2022-01-19 NOTE — Progress Notes (Signed)
Please, let pt know that her cholesterol elevated and her kidney function decreased.  The 10-year ASCVD risk score (risk for heart attack and stroke) is: 32.9%. Please, continue with cholesterol medication, healthy low-carb, low-fat diet and exercise. Also, please, drink plenty of water and avoid taking NSAIDs. We will repeat BMP in a few months.

## 2022-01-23 NOTE — Progress Notes (Signed)
Please, let pt know that her current chest XR showed no active abnormalities. There are chronic abnormalities most likely associated with your hx of sarcoidosis. Recommended a referral to pulmonology if pt agrees. Please, follow up with Neurology. You were last seen on 06/15/21 at [facility] clinic and was advised to be seen in 2 months. Let me know if you need a referral to a new Neurology.

## 2022-02-27 NOTE — Progress Notes (Signed)
Established Patient Office Visit  Subjective   Patient ID: Rebecca Yang, female    DOB: 10/22/1965  Age: 56 y.o. MRN: [id number]  Chief Complaint  Patient presents with   Follow-up    Hypertension    HPI Patient here for 6 weeks follow-up BP. Bp 's readings at home are in the 148-150/80's. Patient denies chest pain, no swelling, chest tightness, sob, headache or light headedness. No near syncope. Currently on Amlodipine 10 mg. Reports no side effect.  BP Readings from Last 3 Encounters: 01/17/22 : (!) 146/96 07/01/21 : (!) 143/66 07/11/20 : (!) 138/95  Review of Systems  All other systems reviewed and are negative. Except see HPI    Objective:     BP 117/87 (BP Location: Right Arm, Patient Position: Sitting, Cuff Size: Normal)   Pulse 88   Temp 98.2 F (36.8 C) (Oral)   Resp 16   Ht _0  (1.626 m)   Wt 148 lb 11.2 oz (67.4 kg)   LMP 01/23/2016 (Approximate)   SpO2 98%   BMI 25.52 kg/m    Physical Exam Vitals reviewed.  Constitutional:      General: She is not in acute distress.    Appearance: Normal appearance. She is well-developed. She is not diaphoretic.  HENT:     Head: Normocephalic and atraumatic.     Nose: Nose normal.  Eyes:     General: No scleral icterus.    Extraocular Movements: Extraocular movements intact.     Conjunctiva/sclera: Conjunctivae normal.     Pupils: Pupils are equal, round, and reactive to light.  Neck:     Thyroid: No thyromegaly.  Cardiovascular:     Rate and Rhythm: Normal rate and regular rhythm.     Pulses: Normal pulses.     Heart sounds: Normal heart sounds. No murmur heard. Pulmonary:     Effort: Pulmonary effort is normal. No respiratory distress.     Breath sounds: Rhonchi present. No wheezing or rales.  Musculoskeletal:        General: Normal range of motion.     Cervical back: Normal range of motion and neck supple.     Right lower leg: No edema.     Left lower leg: No edema.  Lymphadenopathy:      Cervical: No cervical adenopathy.  Skin:    General: Skin is warm and dry.     Findings: No rash.  Neurological:     Mental Status: She is alert and oriented to person, place, and time. Mental status is at baseline.  Psychiatric:        Behavior: Behavior normal.        Thought Content: Thought content normal.        Judgment: Judgment normal.   No results found for any visits on 02/28/22.    The 10-year ASCVD risk score (Arnett DK, et al., 2019) is: 17.9%    Assessment & Plan:   Problem List Items Addressed This Visit   None Visit Diagnoses     Hyperlipidemia, unspecified hyperlipidemia type    -  Primary   Relevant Medications   atorvastatin (LIPITOR) 40 MG tablet   Hypertension associated with diabetes ([place])       Relevant Medications   atorvastatin (LIPITOR) 40 MG tablet   Current every day smoker       Relevant Medications   Varenicline Tartrate, Starter, (CHANTIX STARTING MONTH PAK) 0.5 MG X 11 & 1 MG X 42 TBPK   Tobacco abuse  counseling          Hypertension associated with diabetes ([place]) Chronic and stable Her BP today was 117/87 Her labs from 01/18/22 were WNL except elevated cholesterol, her ASCVD risk score was 32/9%.  Questioning medication compliance.  Encouraged to take atorvastatin. Pt states that she has been taking statin. Recommended to increased a dose of atorvastatin to 40 mg daily. GFR was 57 , advised that she needs to drink 8-12 glasses of water every day, avoid NSAIDs, and have renal functions check avery 6-12 months to ensure stability.     Continue to take amlodipine 66m daily. Per chart review: MRI from 07/08/21 showed scattered chronic micro hemorrhages , likely related to poorly controlled hypertension.  She denies taking lisinopril and losartan. Questioned pt's compliance She was Rxed lisinopril by Neurology. Unclear if she has been taking lisinopril Will FU  Hyperlipidemia, unspecified hyperlipidemia type Chronic and stable   Continue taking statin. Since her last lipids were elevated, pt was advised to increase her dose of statin to 40 mg  daily. A new Rx was sent to her pharmacy. Last lipids were elevated: cholesterol 202 and LDL cholesterol 131 Encouraged healthy diet and exercise Will FU  The 10-year ASCVD risk score (Arnett DK, et al., 2019) is: 17.9%   Values used to calculate the score:     Age: [age]years     Sex: Female     Is Non-Hispanic African American: Yes     Diabetic: Yes     Tobacco smoker: Yes     Systolic Blood Pressure: 1323mmHg     Is BP treated: Yes     HDL Cholesterol: 70 mg/dL     Total Cholesterol: 213 mg/dL  Current day smoker  0.2ppd, x Pack-years Ready to quit smoking and agreed to proceed with Chantix Starting kit of chantix was ordered, see Rx. Her CXR from 01/18/22 showed no active cardiopulmonary disease. There were chronic curvilinear scarring and mild parenchymal distortion throughout the mid to upper lungs bilaterally. Abnormal lung sounds/on PE Hx of sarcoidosis Will reassess at her FU  Advised to schedule her appt with her pulmonology as [facility]  Of note, Hyperglycemia Improved. Last A1C from 01/18/22 was 5.5 Numbness/tingling/weakness Needs to be followed by Neurology, see note Other iron deficiency anemia Improved. Last labs from 01/18/22 WNL  FU as scheduled, might need to scheduled CPE to cover mammogram, pap smear , colonoscopy, DM kidney evaluation and hep C Waiting for her old records  The most urgent problems for her were addressed during the visit   The patient was advised to call back or seek an in-person evaluation if the symptoms worsen or if the condition fails to improve as anticipated.   I discussed the assessment and treatment plan with the patient. The patient was provided an opportunity to ask questions and all were answered. The patient agreed with the plan and demonstrated an understanding of the instructions.    The entirety of the information documented in the History of Present Illness, Review of Systems and Physical Exam were personally obtained by me. Portions of this information were initially documented by the CMA and reviewed by me for thoroughness and accuracy.      JMardene Speak PA-C  [phone](phone) [phone](fax)   [place]

## 2022-02-28 ENCOUNTER — Ambulatory Visit (INDEPENDENT_AMBULATORY_CARE_PROVIDER_SITE_OTHER): Payer: Medicaid Other | Admitting: Physician Assistant

## 2022-02-28 ENCOUNTER — Encounter: Payer: Self-pay | Admitting: Physician Assistant

## 2022-02-28 VITALS — BP 117/87 | HR 88 | Temp 98.2°F | Resp 16 | Ht 64.0 in | Wt 148.7 lb

## 2022-02-28 DIAGNOSIS — Z716 Tobacco abuse counseling: Secondary | ICD-10-CM

## 2022-02-28 DIAGNOSIS — I152 Hypertension secondary to endocrine disorders: Secondary | ICD-10-CM

## 2022-02-28 DIAGNOSIS — R202 Paresthesia of skin: Secondary | ICD-10-CM

## 2022-02-28 DIAGNOSIS — E785 Hyperlipidemia, unspecified: Secondary | ICD-10-CM | POA: Diagnosis not present

## 2022-02-28 DIAGNOSIS — Z7689 Persons encountering health services in other specified circumstances: Secondary | ICD-10-CM

## 2022-02-28 DIAGNOSIS — E1159 Type 2 diabetes mellitus with other circulatory complications: Secondary | ICD-10-CM

## 2022-02-28 DIAGNOSIS — F17200 Nicotine dependence, unspecified, uncomplicated: Secondary | ICD-10-CM | POA: Diagnosis not present

## 2022-02-28 DIAGNOSIS — D869 Sarcoidosis, unspecified: Secondary | ICD-10-CM

## 2022-02-28 DIAGNOSIS — R900 Intracranial space-occupying lesion found on diagnostic imaging of central nervous system: Secondary | ICD-10-CM

## 2022-02-28 DIAGNOSIS — J449 Chronic obstructive pulmonary disease, unspecified: Secondary | ICD-10-CM

## 2022-02-28 DIAGNOSIS — D508 Other iron deficiency anemias: Secondary | ICD-10-CM

## 2022-02-28 DIAGNOSIS — R739 Hyperglycemia, unspecified: Secondary | ICD-10-CM

## 2022-02-28 MED ORDER — ATORVASTATIN CALCIUM 40 MG PO TABS: 40.0000 mg | ORAL_TABLET | Freq: Every day | ORAL | 3 refills | Status: DC

## 2022-02-28 MED ORDER — VARENICLINE TARTRATE (STARTER) 0.5 MG X 11 & 1 MG X 42 PO TBPK: ORAL_TABLET | ORAL | 0 refills | Status: DC

## 2022-04-17 ENCOUNTER — Other Ambulatory Visit: Payer: Self-pay | Admitting: Physician Assistant

## 2022-04-17 DIAGNOSIS — E1159 Type 2 diabetes mellitus with other circulatory complications: Secondary | ICD-10-CM

## 2022-05-30 ENCOUNTER — Ambulatory Visit: Payer: Commercial Managed Care - HMO | Admitting: Physician Assistant

## 2022-06-13 ENCOUNTER — Ambulatory Visit (INDEPENDENT_AMBULATORY_CARE_PROVIDER_SITE_OTHER): Payer: 59 | Admitting: Physician Assistant

## 2022-06-13 VITALS — BP 100/75 | HR 82 | Temp 98.4°F | Wt 158.0 lb

## 2022-06-13 DIAGNOSIS — I152 Hypertension secondary to endocrine disorders: Secondary | ICD-10-CM

## 2022-06-13 DIAGNOSIS — E785 Hyperlipidemia, unspecified: Secondary | ICD-10-CM | POA: Diagnosis not present

## 2022-06-13 DIAGNOSIS — R200 Anesthesia of skin: Secondary | ICD-10-CM

## 2022-06-13 DIAGNOSIS — F17200 Nicotine dependence, unspecified, uncomplicated: Secondary | ICD-10-CM

## 2022-06-13 DIAGNOSIS — E1159 Type 2 diabetes mellitus with other circulatory complications: Secondary | ICD-10-CM | POA: Diagnosis not present

## 2022-06-13 DIAGNOSIS — R739 Hyperglycemia, unspecified: Secondary | ICD-10-CM | POA: Diagnosis not present

## 2022-06-13 DIAGNOSIS — R202 Paresthesia of skin: Secondary | ICD-10-CM | POA: Diagnosis not present

## 2022-06-13 NOTE — Progress Notes (Unsigned)
      Established patient visit   Patient: Rebecca Yang   DOB: 1965/08/11   57 y.o. Female  MRN: [id number] Visit Date: 06/13/2022  Today's healthcare provider: Mardene Speak, PA-C   No chief complaint on file.  Subjective    HPI  Hypertension, follow-up  BP Readings from Last 3 Encounters:  06/13/22 100/75  02/28/22 117/87  01/17/22 (!) 146/96   Wt Readings from Last 3 Encounters:  06/13/22 158 lb (71.7 kg)  02/28/22 148 lb 11.2 oz (67.4 kg)  01/17/22 141 lb (64 kg)     She was last seen for hypertension 3 months ago.  BP at that visit was as above. Management since that visit includes none.  She reports good compliance with treatment. She is not having side effects.  She is following a Regular diet. She is not exercising. She does smoke.  Use of agents associated with hypertension: none.   Outside blood pressures are not being checked. Symptoms: No chest pain No chest pressure  No palpitations No syncope  No dyspnea No orthopnea  No paroxysmal nocturnal dyspnea No lower extremity edema   Pertinent labs Lab Results  Component Value Date   CHOL 213 (H) 01/18/2022   HDL 70 01/18/2022   LDLCALC 126 (H) 01/18/2022   TRIG 94 01/18/2022   CHOLHDL 3.0 01/18/2022   Lab Results  Component Value Date   NA 142 01/18/2022   K 4.0 01/18/2022   CREATININE 1.12 (H) 01/18/2022   EGFR 57 (L) 01/18/2022   GLUCOSE 97 01/18/2022     The ASCVD Risk score (Arnett DK, et al., 2019) failed to calculate for the following reasons:   The patient has a prior MI or stroke diagnosis  --------------------------------------------------------------------------------------------------- 3 cigarettes   Medications: Outpatient Medications Prior to Visit  Medication Sig   acetaminophen (TYLENOL) 325 MG tablet Take 650 mg by mouth every 6 (six) hours as needed.   amLODipine (NORVASC) 10 MG tablet Take 1 tablet by mouth once daily   atorvastatin (LIPITOR) 40 MG tablet Take 1  tablet (40 mg total) by mouth daily.   diphenhydramine-acetaminophen (TYLENOL PM) 25-500 MG TABS tablet Take 1 tablet by mouth at bedtime as needed.   losartan (COZAAR) 25 MG tablet Take 1 tablet (25 mg total) by mouth daily.   Varenicline Tartrate, Starter, (CHANTIX STARTING MONTH PAK) 0.5 MG X 11 & 1 MG X 42 TBPK Initial, 0.5 mg orally once daily for 3 days, then 0.5 mg twice daily on days 4 through 7, and then 1 mg twice daily thereafter for 12 weeks of treatment; 12 additional weeks of therapy may increase likelihood of long-term abstinence in patients who successfully stop smoking (FDA dosage)   No facility-administered medications prior to visit.    Review of Systems      Objective    BP 100/75 (BP Location: Right Arm, Patient Position: Sitting, Cuff Size: Normal)   Pulse 82   Temp 98.4 F (36.9 C) (Oral)   Wt 158 lb (71.7 kg)   LMP 01/23/2016 (Approximate)   SpO2 97%   BMI 27.12 kg/m     Physical Exam     No results found for any visits on 06/13/22.  Assessment & Plan     ***  No follow-ups on file.      {provider attestation***:1}   Mardene Speak, PA-C  [place] [phone] (phone) [phone] (fax)  [place]

## 2022-06-14 LAB — COMPREHENSIVE METABOLIC PANEL
ALT: 20 IU/L (ref 0–32)
AST: 18 IU/L (ref 0–40)
Albumin/Globulin Ratio: 1.4 (ref 1.2–2.2)
Albumin: 4.5 g/dL (ref 3.8–4.9)
Alkaline Phosphatase: 118 IU/L (ref 44–121)
BUN/Creatinine Ratio: 12 (ref 9–23)
BUN: 11 mg/dL (ref 6–24)
Bilirubin Total: 0.4 mg/dL (ref 0.0–1.2)
CO2: 21 mmol/L (ref 20–29)
Calcium: 9.7 mg/dL (ref 8.7–10.2)
Chloride: 104 mmol/L (ref 96–106)
Creatinine, Ser: 0.94 mg/dL (ref 0.57–1.00)
Globulin, Total: 3.3 g/dL (ref 1.5–4.5)
Glucose: 96 mg/dL (ref 70–99)
Potassium: 3.9 mmol/L (ref 3.5–5.2)
Sodium: 141 mmol/L (ref 134–144)
Total Protein: 7.8 g/dL (ref 6.0–8.5)
eGFR: 70 mL/min/{1.73_m2} (ref >59)

## 2022-06-14 LAB — MICROALBUMIN / CREATININE URINE RATIO
Creatinine, Urine: 143.9 mg/dL
Microalb/Creat Ratio: 15 mg/g creat (ref 0–29)
Microalbumin, Urine: 21.9 ug/mL

## 2022-06-14 LAB — HEMOGLOBIN A1C
Est. average glucose Bld gHb Est-mCnc: 140 mg/dL
Hgb A1c MFr Bld: 6.5 % — ABNORMAL HIGH (ref 4.8–5.6)

## 2022-06-15 ENCOUNTER — Encounter: Payer: Self-pay | Admitting: Physician Assistant

## 2022-06-15 DIAGNOSIS — R739 Hyperglycemia, unspecified: Secondary | ICD-10-CM | POA: Insufficient documentation

## 2022-06-15 DIAGNOSIS — F17200 Nicotine dependence, unspecified, uncomplicated: Secondary | ICD-10-CM | POA: Insufficient documentation

## 2022-06-15 DIAGNOSIS — E1159 Type 2 diabetes mellitus with other circulatory complications: Secondary | ICD-10-CM | POA: Insufficient documentation

## 2022-06-15 DIAGNOSIS — E119 Type 2 diabetes mellitus without complications: Secondary | ICD-10-CM | POA: Insufficient documentation

## 2022-06-15 DIAGNOSIS — E785 Hyperlipidemia, unspecified: Secondary | ICD-10-CM | POA: Insufficient documentation

## 2022-06-15 NOTE — Progress Notes (Signed)
Please,let pt know that her A1C was 6.5 , in diabetic range. Advised to start on Metformin if she agrees, low carb diet and daily exercise. Continue the rest of her current medications. Will reassess in May

## 2022-07-13 ENCOUNTER — Ambulatory Visit: Payer: 59 | Admitting: Physician Assistant

## 2022-07-14 NOTE — Progress Notes (Unsigned)
Complete physical exam   Patient: Rebecca Yang   DOB: June 01, 1965   57 y.o. Female  MRN: [id number] Visit Date: 07/17/2022  Today's healthcare provider: Debera Lat, PA-C   No chief complaint on file.  Subjective    Rebecca Yang is a 57 y.o. female who presents today for a complete physical exam.  She reports consuming a {diet types:17450} diet. {Exercise:19826} She generally feels {well/fairly well/poorly:18703}. She reports sleeping {well/fairly well/poorly:18703}. She {does/does not:200015} have additional problems to discuss today.  HPI  ***  Past Medical History:  Diagnosis Date   Hyperlipidemia    Hypertension    Neuropathy    Sarcoidosis    Stroke [facility])    Past Surgical History:  Procedure Laterality Date   BREAST SURGERY  2000   lumpectomy   GALLBLADDER SURGERY     HAND SURGERY     SKIN SURGERY     Social History   Socioeconomic History   Marital status: Single    Spouse name: Not on file   Number of children: 3   Years of education: Not on file   Highest education level: Not on file  Occupational History   Not on file  Tobacco Use   Smoking status: Every Day    Packs/day: .2    Types: Cigarettes   Smokeless tobacco: Never  Vaping Use   Vaping Use: Never used  Substance and Sexual Activity   Alcohol use: Not Currently    Comment: occas, last drink 07/04/20    Drug use: Not Currently   Sexual activity: Not on file  Other Topics Concern   Not on file  Social History Narrative   Not on file   Social Determinants of Health   Financial Resource Strain: Not on file  Food Insecurity: Not on file  Transportation Needs: Not on file  Physical Activity: Not on file  Stress: Not on file  Social Connections: Not on file  Intimate Partner Violence: Not on file   Family Status  Relation Name Status   Brother  (Not Specified)   Son  (Not Specified)   MGM  (Not Specified)   Family History  Problem Relation Age of Onset   Gout Brother     Asthma Son    Gout Maternal Grandmother    Allergies  Allergen Reactions   Penicillins Hives    Patient Care Team: [facility] as PCP - General (General Practice)   Medications: Outpatient Medications Prior to Visit  Medication Sig   acetaminophen (TYLENOL) 325 MG tablet Take 650 mg by mouth every 6 (six) hours as needed.   amLODipine (NORVASC) 10 MG tablet Take 1 tablet by mouth once daily   atorvastatin (LIPITOR) 40 MG tablet Take 1 tablet (40 mg total) by mouth daily.   diphenhydramine-acetaminophen (TYLENOL PM) 25-500 MG TABS tablet Take 1 tablet by mouth at bedtime as needed.   losartan (COZAAR) 25 MG tablet Take 1 tablet (25 mg total) by mouth daily.   Varenicline Tartrate, Starter, (CHANTIX STARTING MONTH PAK) 0.5 MG X 11 & 1 MG X 42 TBPK Initial, 0.5 mg orally once daily for 3 days, then 0.5 mg twice daily on days 4 through 7, and then 1 mg twice daily thereafter for 12 weeks of treatment; 12 additional weeks of therapy may increase likelihood of long-term abstinence in patients who successfully stop smoking (FDA dosage)   No facility-administered medications prior to visit.    Review of Systems  {Labs  Heme  Chem  Endocrine  Serology  Results Review (optional):23779}  Objective    LMP 01/23/2016 (Approximate)  {Show previous vital signs (optional):23777}   Physical Exam  ***  Last depression screening scores    06/13/2022    1:44 PM 02/28/2022    1:56 PM 01/17/2022    2:16 PM  PHQ 2/9 Scores  PHQ - 2 Score 1 0 0  PHQ- 9 Score 4 0 1   Last fall risk screening    06/13/2022    1:44 PM  Fall Risk   Falls in the past year? 0  Number falls in past yr: 0  Injury with Fall? 0   Last Audit-C alcohol use screening    06/13/2022    1:44 PM  Alcohol Use Disorder Test (AUDIT)  1. How often do you have a drink containing alcohol? 0  2. How many drinks containing alcohol do you have on a typical day when you are drinking? 0  3. How often do  you have six or more drinks on one occasion? 0  AUDIT-C Score 0   A score of 3 or more in women, and 4 or more in men indicates increased risk for alcohol abuse, EXCEPT if all of the points are from question 1   No results found for any visits on 07/17/22.  Assessment & Plan    Routine Health Maintenance and Physical Exam  Exercise Activities and Dietary recommendations  Goals   None      There is no immunization history on file for this patient.  Health Maintenance  Topic Date Due   COVID-19 Vaccine (1) Never done   FOOT EXAM  Never done   OPHTHALMOLOGY EXAM  Never done   Hepatitis C Screening  Never done   PAP SMEAR-Modifier  Never done   COLONOSCOPY (Pts 45-54yrs Insurance coverage will need to be confirmed)  Never done   MAMMOGRAM  Never done   Zoster Vaccines- Shingrix (1 of 2) Never done   INFLUENZA VACCINE  10/12/2022   HEMOGLOBIN A1C  12/13/2022   Diabetic kidney evaluation - eGFR measurement  06/13/2023   Diabetic kidney evaluation - Urine ACR  06/13/2023   HIV Screening  Completed   HPV VACCINES  Aged Out   DTaP/Tdap/Td  Discontinued    Discussed health benefits of physical activity, and encouraged her to engage in regular exercise appropriate for her age and condition.  ***  No follow-ups on file.     {provider attestation***:1}   Debera Lat, PA-C  [facility] [phone] (phone) [phone] (fax)  [facility]

## 2022-07-17 ENCOUNTER — Ambulatory Visit (INDEPENDENT_AMBULATORY_CARE_PROVIDER_SITE_OTHER): Payer: 59 | Admitting: Physician Assistant

## 2022-07-17 VITALS — BP 102/81 | HR 80 | Temp 98.6°F | Ht 64.0 in | Wt 155.0 lb

## 2022-07-17 DIAGNOSIS — J449 Chronic obstructive pulmonary disease, unspecified: Secondary | ICD-10-CM

## 2022-07-17 DIAGNOSIS — Z1211 Encounter for screening for malignant neoplasm of colon: Secondary | ICD-10-CM

## 2022-07-17 DIAGNOSIS — E119 Type 2 diabetes mellitus without complications: Secondary | ICD-10-CM

## 2022-07-17 DIAGNOSIS — Z0000 Encounter for general adult medical examination without abnormal findings: Secondary | ICD-10-CM

## 2022-07-17 DIAGNOSIS — Z01419 Encounter for gynecological examination (general) (routine) without abnormal findings: Secondary | ICD-10-CM

## 2022-07-17 DIAGNOSIS — Z1231 Encounter for screening mammogram for malignant neoplasm of breast: Secondary | ICD-10-CM

## 2022-07-17 MED ORDER — METFORMIN HCL 500 MG PO TABS: 500.0000 mg | ORAL_TABLET | Freq: Every day | ORAL | 3 refills | Status: DC

## 2022-07-19 ENCOUNTER — Encounter: Payer: Self-pay | Admitting: Physician Assistant

## 2022-07-20 ENCOUNTER — Telehealth: Payer: Self-pay

## 2022-07-20 ENCOUNTER — Other Ambulatory Visit: Payer: Self-pay

## 2022-07-20 DIAGNOSIS — Z1211 Encounter for screening for malignant neoplasm of colon: Secondary | ICD-10-CM

## 2022-07-20 MED ORDER — NA SULFATE-K SULFATE-MG SULF 17.5-3.13-1.6 GM/177ML PO SOLN: 1.0000 | Freq: Once | ORAL | 0 refills | Status: AC

## 2022-07-20 NOTE — Telephone Encounter (Signed)
Gastroenterology Pre-Procedure Review  Request Date: 08/22/22 Requesting Physician: Dr. Tobi Bastos  PATIENT REVIEW QUESTIONS: The patient responded to the following health history questions as indicated:    1. Are you having any GI issues? no 2. Do you have a personal history of Polyps? no 3. Do you have a family history of Colon Cancer or Polyps? no 4. Diabetes Mellitus? Yes but stated she is not taking the metformin 5. Joint replacements in the past 12 months?no 6. Major health problems in the past 3 months?no 7. Any artificial heart valves, MVP, or defibrillator?no    MEDICATIONS & ALLERGIES:    Patient reports the following regarding taking any anticoagulation/antiplatelet therapy:   Plavix, Coumadin, Eliquis, Xarelto, Lovenox, Pradaxa, Brilinta, or Effient? no Aspirin? no  Patient confirms/reports the following medications:  Current Outpatient Medications  Medication Sig Dispense Refill   acetaminophen (TYLENOL) 325 MG tablet Take 650 mg by mouth every 6 (six) hours as needed.     amLODipine (NORVASC) 10 MG tablet Take 1 tablet by mouth once daily 90 tablet 0   atorvastatin (LIPITOR) 40 MG tablet Take 1 tablet (40 mg total) by mouth daily. 90 tablet 3   diphenhydramine-acetaminophen (TYLENOL PM) 25-500 MG TABS tablet Take 1 tablet by mouth at bedtime as needed. (Patient not taking: Reported on 07/20/2022)     losartan (COZAAR) 25 MG tablet Take 1 tablet (25 mg total) by mouth daily. (Patient not taking: Reported on 07/17/2022) 30 tablet 2   metFORMIN (GLUCOPHAGE) 500 MG tablet Take 1 tablet (500 mg total) by mouth daily with breakfast. (Patient not taking: Reported on 07/20/2022) 180 tablet 3   Varenicline Tartrate, Starter, (CHANTIX STARTING MONTH PAK) 0.5 MG X 11 & 1 MG X 42 TBPK Initial, 0.5 mg orally once daily for 3 days, then 0.5 mg twice daily on days 4 through 7, and then 1 mg twice daily thereafter for 12 weeks of treatment; 12 additional weeks of therapy may increase likelihood of  long-term abstinence in patients who successfully stop smoking (FDA dosage) (Patient not taking: Reported on 07/17/2022) 53 each 0   No current facility-administered medications for this visit.    Patient confirms/reports the following allergies:  Allergies  Allergen Reactions   Penicillins Hives    No orders of the defined types were placed in this encounter.   AUTHORIZATION INFORMATION Primary Insurance: 1D#: Group #:  Secondary Insurance: 1D#: Group #:  SCHEDULE INFORMATION: Date:  Time: Location:

## 2022-07-24 NOTE — Progress Notes (Unsigned)
Referring Physician:  Morene Crocker, MD [phone] [facility] [address] [facility] [place],  [place] [address]  Primary Physician:  Rebecca Lat, PA-C  History of Present Illness: 07/25/2022 [name] is here today with a chief complaint of abnormal brain MRI from April 2022.  She was recently reevaluated by one of her providers and was felt that she needed to follow-up imaging as this was initially recommended.  She has no complaints today.  She has residual left-sided numbness ever since the event which led to her MRI scan.  Past Surgery: denies   Rebecca Yang has no symptoms of cervical myelopathy.  The symptoms are causing a significant impact on the patient's life.   I have utilized the care everywhere function in epic to review the outside records available from external health systems.  Review of Systems:  A 10 point review of systems is negative, except for the pertinent positives and negatives detailed in the HPI.  Past Medical History: Past Medical History:  Diagnosis Date   Hyperlipidemia    Hypertension    Neuropathy    Sarcoidosis    Stroke [facility])     Past Surgical History: Past Surgical History:  Procedure Laterality Date   BREAST SURGERY  2000   lumpectomy   GALLBLADDER SURGERY     HAND SURGERY     SKIN SURGERY      Allergies: Allergies as of 07/25/2022 - Review Complete 07/25/2022  Allergen Reaction Noted   Penicillins Hives 01/05/2015    Medications:  Current Outpatient Medications:    acetaminophen (TYLENOL) 325 MG tablet, Take 650 mg by mouth every 6 (six) hours as needed., Disp: , Rfl:    amLODipine (NORVASC) 10 MG tablet, Take 1 tablet by mouth once daily, Disp: 90 tablet, Rfl: 0   atorvastatin (LIPITOR) 40 MG tablet, Take 1 tablet (40 mg total) by mouth daily., Disp: 90 tablet, Rfl: 3   losartan (COZAAR) 25 MG tablet, Take 1 tablet (25 mg total) by mouth daily. (Patient not taking: Reported on 07/17/2022), Disp:  30 tablet, Rfl: 2   metFORMIN (GLUCOPHAGE) 500 MG tablet, Take 1 tablet (500 mg total) by mouth daily with breakfast. (Patient not taking: Reported on 07/20/2022), Disp: 180 tablet, Rfl: 3  Social History: Social History   Tobacco Use   Smoking status: Every Day    Packs/day: .2    Types: Cigarettes   Smokeless tobacco: Never  Vaping Use   Vaping Use: Never used  Substance Use Topics   Alcohol use: Not Currently    Comment: occas, last drink 07/04/20    Drug use: Not Currently    Family Medical History: Family History  Problem Relation Age of Onset   Gout Brother    Asthma Son    Gout Maternal Grandmother     Physical Examination: Vitals:   07/25/22 1534  BP: 120/82    General: Patient is in no apparent distress. Attention to examination is appropriate.  Neck:   Supple.  Full range of motion.  Respiratory: Patient is breathing without any difficulty.   NEUROLOGICAL:     Awake, alert, oriented to person, place, and time.  Speech is clear and fluent.   Cranial Nerves: Pupils equal round and reactive to light.  Facial tone is symmetric.  Facial sensation is symmetric. Shoulder shrug is symmetric. Tongue protrusion is midline.  There is no pronator drift.  Strength: Side Biceps Triceps Deltoid Interossei Grip Wrist Ext. Wrist Flex.  R 5 5  5 5 5 5 5   L 5 5 5 5 5 5 5    Side Iliopsoas Quads Hamstring PF DF EHL  R 5 5 5 5 5 5   L 5 5 5 5 5 5    Reflexes are 1+ and symmetric at the biceps, triceps, brachioradialis, patella and achilles.   Hoffman's is absent.   She has left-sided hemibody altered sensation but is able to feel.  She describes some low-level numbness. No evidence of dysmetria noted.  Gait is normal.     Medical Decision Making  Imaging: MRI Brain 07/08/2020 IMPRESSION: 1. 2.4 x 1.8 x 1.2 cm mass-like signal abnormality involving the right thalamus with associated regional mass effect and localized 4 mm of right-to-left shift. Finding is  indeterminate, but given overall appearance, location and morphology, an evolving subacute hemorrhage/hemorrhagic infarct is favored. Possible primary CNS neoplasm/mass is difficult to exclude, and would be the primary differential consideration. A short interval follow-up MRI in perhaps 1 week suggested for further evaluation. 2. Multiple additional scattered chronic micro hemorrhages clustered about the deep gray nuclei and brainstem, likely related to chronic poorly controlled hypertension. Finding also suggest at the right thalamic abnormality could reflect a hemorrhage rather than neoplasm. 3. Underlying moderate to advanced chronic microvascular ischemic disease.     Electronically Signed   By: Rise Mu M.D.   On: 07/08/2020 22:19  I have personally reviewed the images and agree with the above interpretation.  Assessment and Plan: [name] is a pleasant 57 y.o. 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  I think it is very unlikely that she has a neoplastic lesion.  That being said, I agree with reimaging her brain to confirm that this lesion has resolved.  I expect that we will see some sequela of her prior intraparenchymal hemorrhage, but no evidence of neoplasm.  After her MRI, I will see her back in clinic to discuss.  Thank you for involving me in the care of this patient.      Jolina Symonds K. Myer Haff MD, [facility] Neurosurgery

## 2022-07-25 ENCOUNTER — Ambulatory Visit (INDEPENDENT_AMBULATORY_CARE_PROVIDER_SITE_OTHER): Payer: Medicaid Other | Admitting: Neurosurgery

## 2022-07-25 ENCOUNTER — Encounter: Payer: Self-pay | Admitting: Neurosurgery

## 2022-07-25 VITALS — BP 120/82 | Ht 64.0 in | Wt 153.6 lb

## 2022-07-25 DIAGNOSIS — R930 Abnormal findings on diagnostic imaging of skull and head, not elsewhere classified: Secondary | ICD-10-CM | POA: Diagnosis not present

## 2022-08-03 ENCOUNTER — Ambulatory Visit (INDEPENDENT_AMBULATORY_CARE_PROVIDER_SITE_OTHER): Payer: Medicaid Other | Admitting: Certified Nurse Midwife

## 2022-08-03 ENCOUNTER — Other Ambulatory Visit (HOSPITAL_COMMUNITY)
Admission: RE | Admit: 2022-08-03 | Discharge: 2022-08-03 | Disposition: A | Discharge status: Home / Self Care | Payer: Medicaid Other | Source: Ambulatory Visit | Attending: Certified Nurse Midwife | Admitting: Certified Nurse Midwife

## 2022-08-03 ENCOUNTER — Encounter: Payer: Self-pay | Admitting: Certified Nurse Midwife

## 2022-08-03 VITALS — BP 121/85 | HR 89 | Wt 151.3 lb

## 2022-08-03 DIAGNOSIS — Z01419 Encounter for gynecological examination (general) (routine) without abnormal findings: Secondary | ICD-10-CM

## 2022-08-03 DIAGNOSIS — Z124 Encounter for screening for malignant neoplasm of cervix: Secondary | ICD-10-CM | POA: Insufficient documentation

## 2022-08-03 DIAGNOSIS — Z1151 Encounter for screening for human papillomavirus (HPV): Secondary | ICD-10-CM

## 2022-08-03 DIAGNOSIS — Z1231 Encounter for screening mammogram for malignant neoplasm of breast: Secondary | ICD-10-CM

## 2022-08-03 DIAGNOSIS — Z720 Tobacco use: Secondary | ICD-10-CM

## 2022-08-03 MED ORDER — NICOTINE POLACRILEX 2 MG MT GUM: 2.0000 mg | CHEWING_GUM | OROMUCOSAL | 0 refills | Status: DC | PRN

## 2022-08-03 NOTE — Progress Notes (Signed)
ANNUAL EXAM Patient name: Rebecca Yang MRN [id number]  Date of birth: July 09, 1965 Chief Complaint:   Gynecologic Exam  History of Present Illness:   Rebecca Yang is a 57 y.o.  African-American female being seen today for a routine annual exam.  Current complaints: denies gynecologic or breast concerns  Patient's last menstrual period was 01/23/2016 (approximate).     Last pap 7-8y ago. Last mammogram: Due, ordered. Family h/o breast cancer: no Last colonoscopy: scheduled 6/11.      07/17/2022    1:25 PM 06/13/2022    1:44 PM 02/28/2022    1:56 PM 01/17/2022    2:16 PM  Depression screen PHQ 2/9  Decreased Interest 0 0 0 0  Down, Depressed, Hopeless 1 1 0 0  PHQ - 2 Score 1 1 0 0  Altered sleeping 2 1 0 0  Tired, decreased energy 2 1 0 1  Change in appetite 0 1 0 0  Feeling bad or failure about yourself  0 0 0 0  Trouble concentrating 0 0 0 0  Moving slowly or fidgety/restless 0 0 0 0  Suicidal thoughts 0 0 0 0  PHQ-9 Score 5 4 0 1  Difficult doing work/chores Not difficult at all Not difficult at all Not difficult at all Not difficult at all         No data to display           Review of Systems:   Pertinent items are noted in HPI Denies any headaches, blurred vision, fatigue, shortness of breath, chest pain, abdominal pain, abnormal vaginal discharge/itching/odor/irritation, problems with periods, bowel movements, urination, or intercourse unless otherwise stated above. Pertinent History Reviewed:  Reviewed past medical,surgical, social and family history.  Reviewed problem list, medications and allergies. Physical Assessment:   Vitals:   08/03/22 1305  BP: 121/85  Pulse: 89  Weight: 151 lb 4.8 oz (68.6 kg)  Body mass index is 25.97 kg/m.        Physical Examination:   General appearance - well appearing, and in no distress  Mental status - alert, oriented to person, place, and time  Psych:  She has a normal mood and affect  Skin - warm and dry,  normal color, no suspicious lesions noted  Chest - effort normal, all lung fields clear to auscultation bilaterally  Heart - normal rate and regular rhythm  Neck:  midline trachea, no thyromegaly or nodules  Breasts - breasts appear normal, no suspicious masses, no skin or nipple changes or axillary nodes  Abdomen - soft, nontender, nondistended, no masses or organomegaly  Pelvic - VULVA: normal appearing vulva with no masses, tenderness or lesions   VAGINA: normal appearing vagina with normal color and discharge, no lesions   CERVIX: normal appearing cervix without discharge or lesions, no CMT  Thin prep pap is done with HR HPV cotesting  UTERUS: uterus is felt to be normal size, shape, consistency and nontender   ADNEXA: No adnexal masses or tenderness noted.  Extremities:  No swelling or varicosities noted  Chaperone present for exam  No results found for this or any previous visit (from the past 24 hour(s)).  Assessment & Plan:  1) Well-Woman Exam  2) Smoking cessation encouraged & she would like to stop, would like to try nicotine gum, prescribed today.   3) Recent hgbA1c 6.5, prescribed metformin but has not started due to concerns for side effects, recommended calling PCP office to discuss alternatives prior to scheduled followup  4) screening: mammogram ordered, colonoscopy is scheduled  5) vitamin D & calcium supplementation encouraged    Mammogram: schedule screening mammo as soon as possible, ordered today Colonoscopy:  is scheduled   08/22/22 Orders Placed This Encounter  Procedures   MM DIGITAL SCREENING BILATERAL    Meds:  Meds ordered this encounter  Medications   nicotine polacrilex (NICORETTE) 2 MG gum    Sig: Take 1 each (2 mg total) by mouth as needed for smoking cessation.    Dispense:  100 tablet    Refill:  0    Follow-up: Return in 1 year (on 08/03/2023) for Annual exam.  Dominica Severin, CNM 08/03/2022 1:49 PM

## 2022-08-09 LAB — CYTOLOGY - PAP
Comment: NEGATIVE
Diagnosis: NEGATIVE
High risk HPV: NEGATIVE

## 2022-08-11 ENCOUNTER — Ambulatory Visit
Admission: RE | Admit: 2022-08-11 | Discharge: 2022-08-11 | Disposition: A | Discharge status: Home / Self Care | Payer: Medicaid Other | Source: Ambulatory Visit | Attending: Neurosurgery | Admitting: Neurosurgery

## 2022-08-11 ENCOUNTER — Other Ambulatory Visit: Payer: Self-pay | Admitting: Neurosurgery

## 2022-08-11 DIAGNOSIS — R930 Abnormal findings on diagnostic imaging of skull and head, not elsewhere classified: Secondary | ICD-10-CM

## 2022-08-11 MED ORDER — GADOPICLENOL 0.5 MMOL/ML IV SOLN
7.0000 mL | Freq: Once | INTRAVENOUS | Status: AC | PRN
  Administered 2022-08-11: 7 mL via INTRAVENOUS

## 2022-08-18 ENCOUNTER — Telehealth: Payer: Self-pay

## 2022-08-18 NOTE — Telephone Encounter (Signed)
Pt message to cancel procedure for 08/22/2022 would like a call back to reschedule

## 2022-08-21 NOTE — Telephone Encounter (Signed)
Called patient and she inform me that she does not transportation, any one to stay and drive her home.  Patient insisted that cancel but no reschedule was made because she is unsure of when she can do this.  Called endo unit to make the change.

## 2022-08-22 ENCOUNTER — Encounter: Admission: RE | Payer: Self-pay | Source: Home / Self Care

## 2022-08-22 ENCOUNTER — Ambulatory Visit: Admission: RE | Admit: 2022-08-22 | Payer: Medicaid Other | Source: Home / Self Care | Admitting: Gastroenterology

## 2022-08-22 SURGERY — COLONOSCOPY WITH PROPOFOL
Anesthesia: General

## 2022-08-25 ENCOUNTER — Telehealth: Payer: Self-pay

## 2022-08-25 ENCOUNTER — Telehealth: Payer: Self-pay | Admitting: Neurosurgery

## 2022-08-25 NOTE — Telephone Encounter (Signed)
Pt called back; I let the pt know based on the MRI, Dr. Jeannie Fend does not see any need for further imaging or a f/u.

## 2022-08-25 NOTE — Telephone Encounter (Signed)
Left voicemail.  The repeat MRI scan does not show any evidence of neoplastic lesions.  She does not need any further imaging or follow-up.

## 2022-08-25 NOTE — Telephone Encounter (Signed)
-----   Message from Venetia Night, MD sent at 08/25/2022 11:03 AM EDT ----- Rebecca Yang-  I tried to call her with her results.  It is okay to let her know that there is no worrisome abnormality on her MRI scan.  She does not need any further imaging or follow-up with [place].  Thanks  ----- Message ----- From: Interface, Rad Results In Sent: 08/23/2022  11:57 AM EDT To: Venetia Night, MD

## 2022-08-25 NOTE — Progress Notes (Deleted)
.  pcpn 

## 2022-08-28 ENCOUNTER — Ambulatory Visit: Payer: 59 | Admitting: Physician Assistant

## 2022-08-28 NOTE — Telephone Encounter (Signed)
Left a message for the patient to call back.  

## 2022-09-05 ENCOUNTER — Ambulatory Visit
Admission: RE | Admit: 2022-09-05 | Discharge: 2022-09-05 | Disposition: A | Discharge status: Home / Self Care | Payer: 59 | Source: Ambulatory Visit | Attending: Physician Assistant | Admitting: Physician Assistant

## 2022-09-05 DIAGNOSIS — Z1231 Encounter for screening mammogram for malignant neoplasm of breast: Secondary | ICD-10-CM | POA: Diagnosis present

## 2022-09-07 ENCOUNTER — Other Ambulatory Visit: Payer: Self-pay | Admitting: Physician Assistant

## 2022-09-07 DIAGNOSIS — R928 Other abnormal and inconclusive findings on diagnostic imaging of breast: Secondary | ICD-10-CM

## 2022-09-11 ENCOUNTER — Ambulatory Visit: Payer: 59 | Admitting: Physician Assistant

## 2022-09-15 IMAGING — DX DG HAND COMPLETE 3+V*L*
3 series · 3 of 3 positions shown · non-contrast
Comparison: None.

CLINICAL DATA: Left hand pain and numbness without known injury.

EXAM:
LEFT HAND - COMPLETE 3+ VIEW

[hand ap]
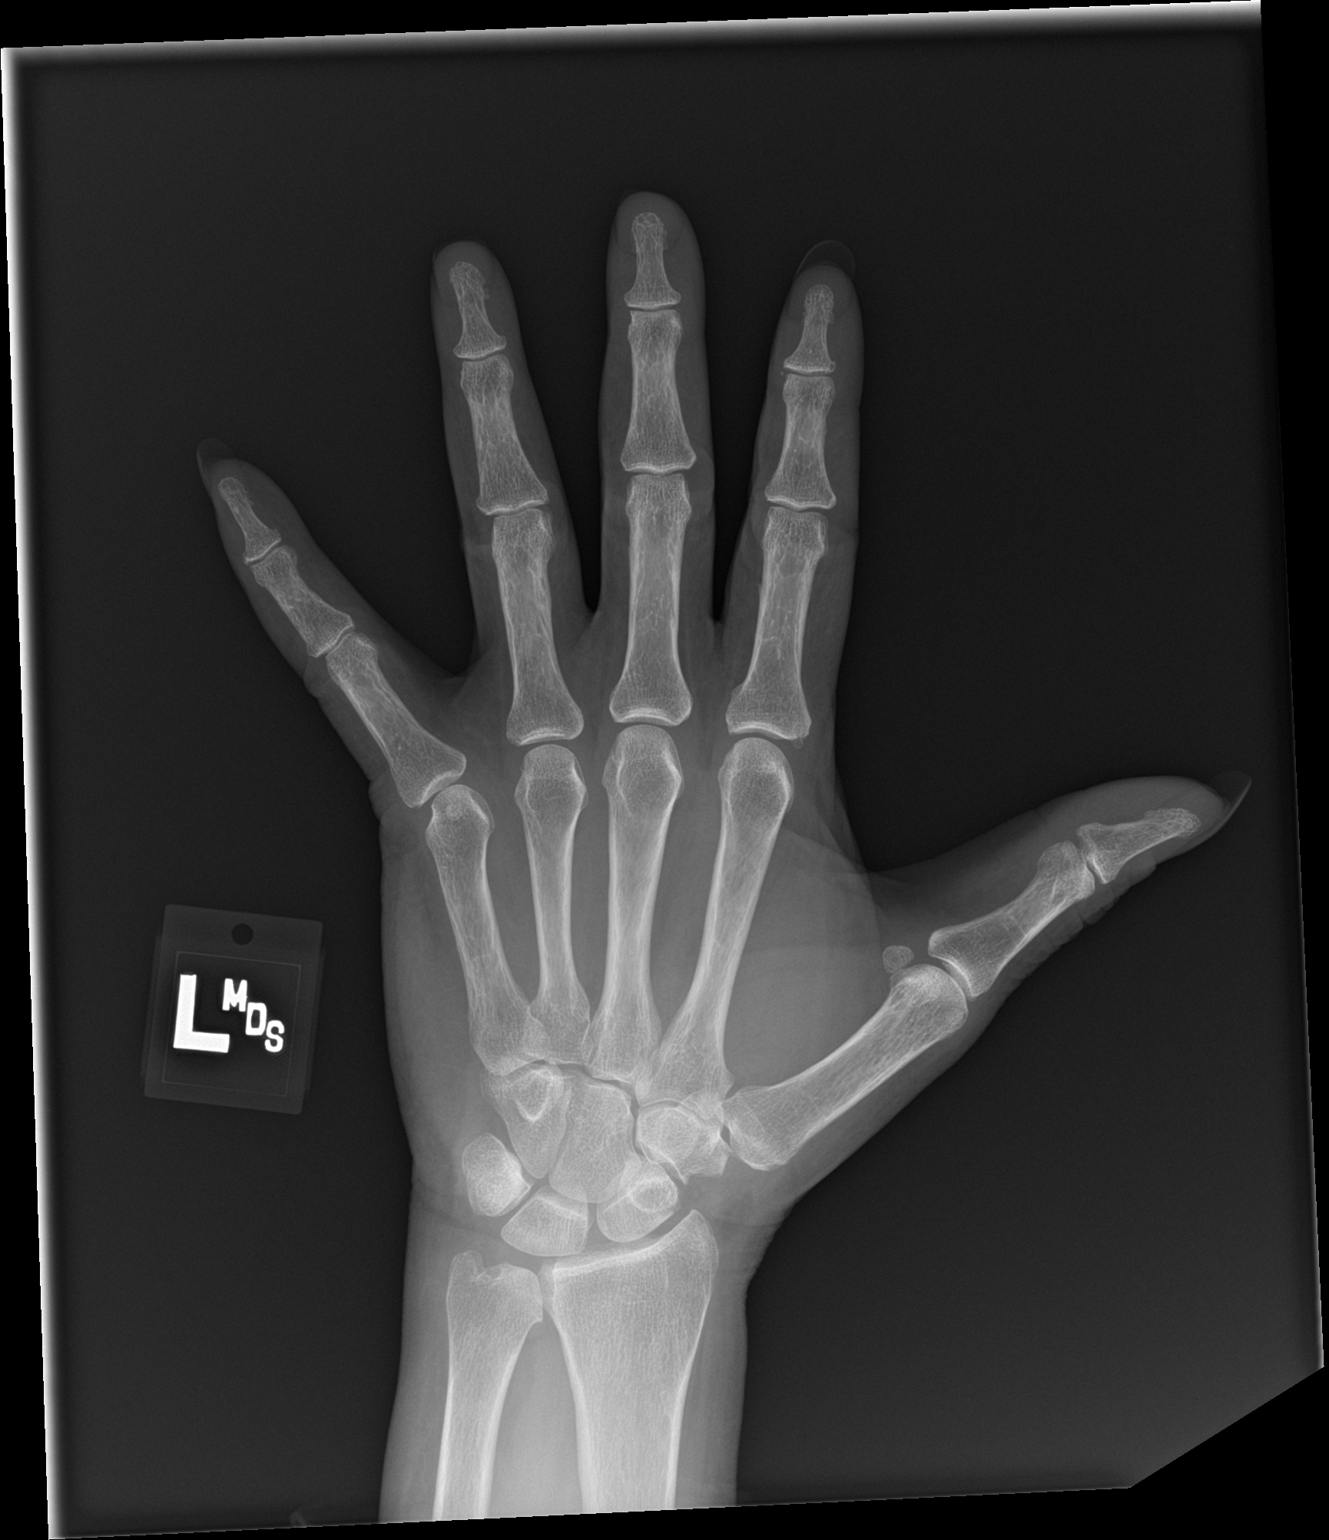

[hand obl]
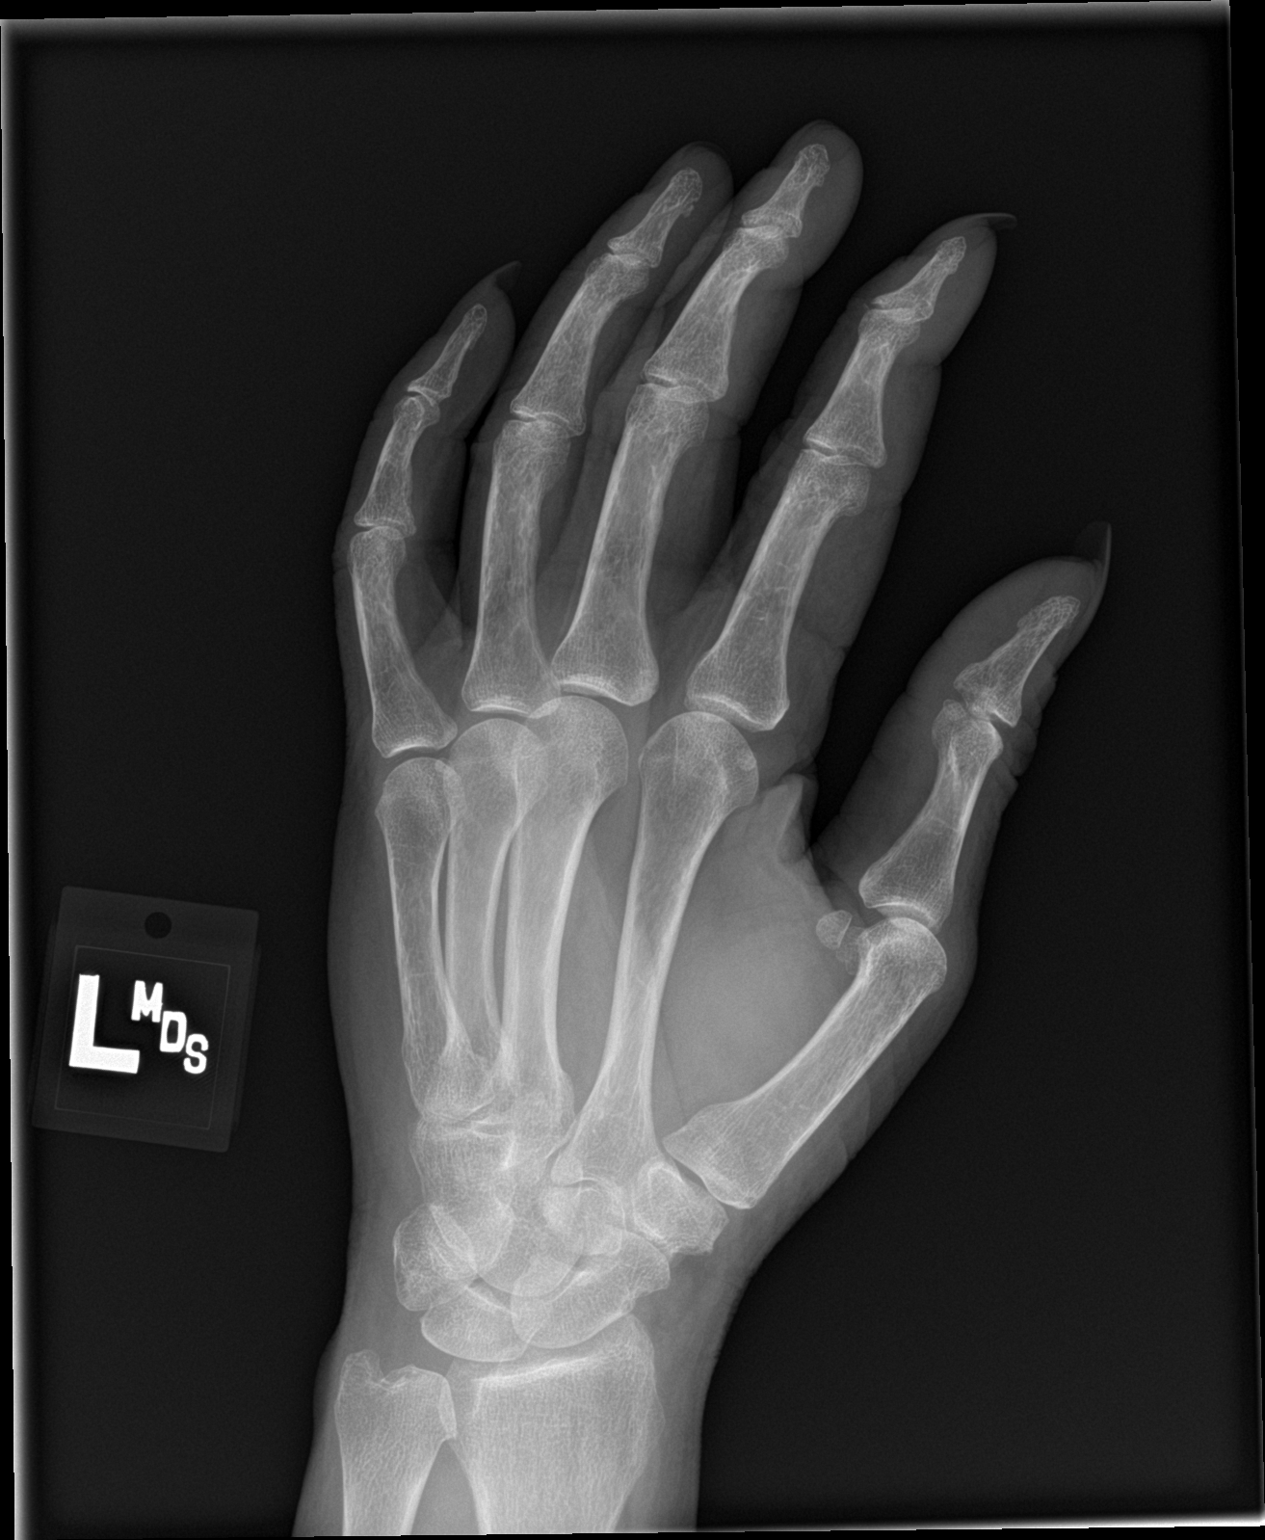

[hand lat]
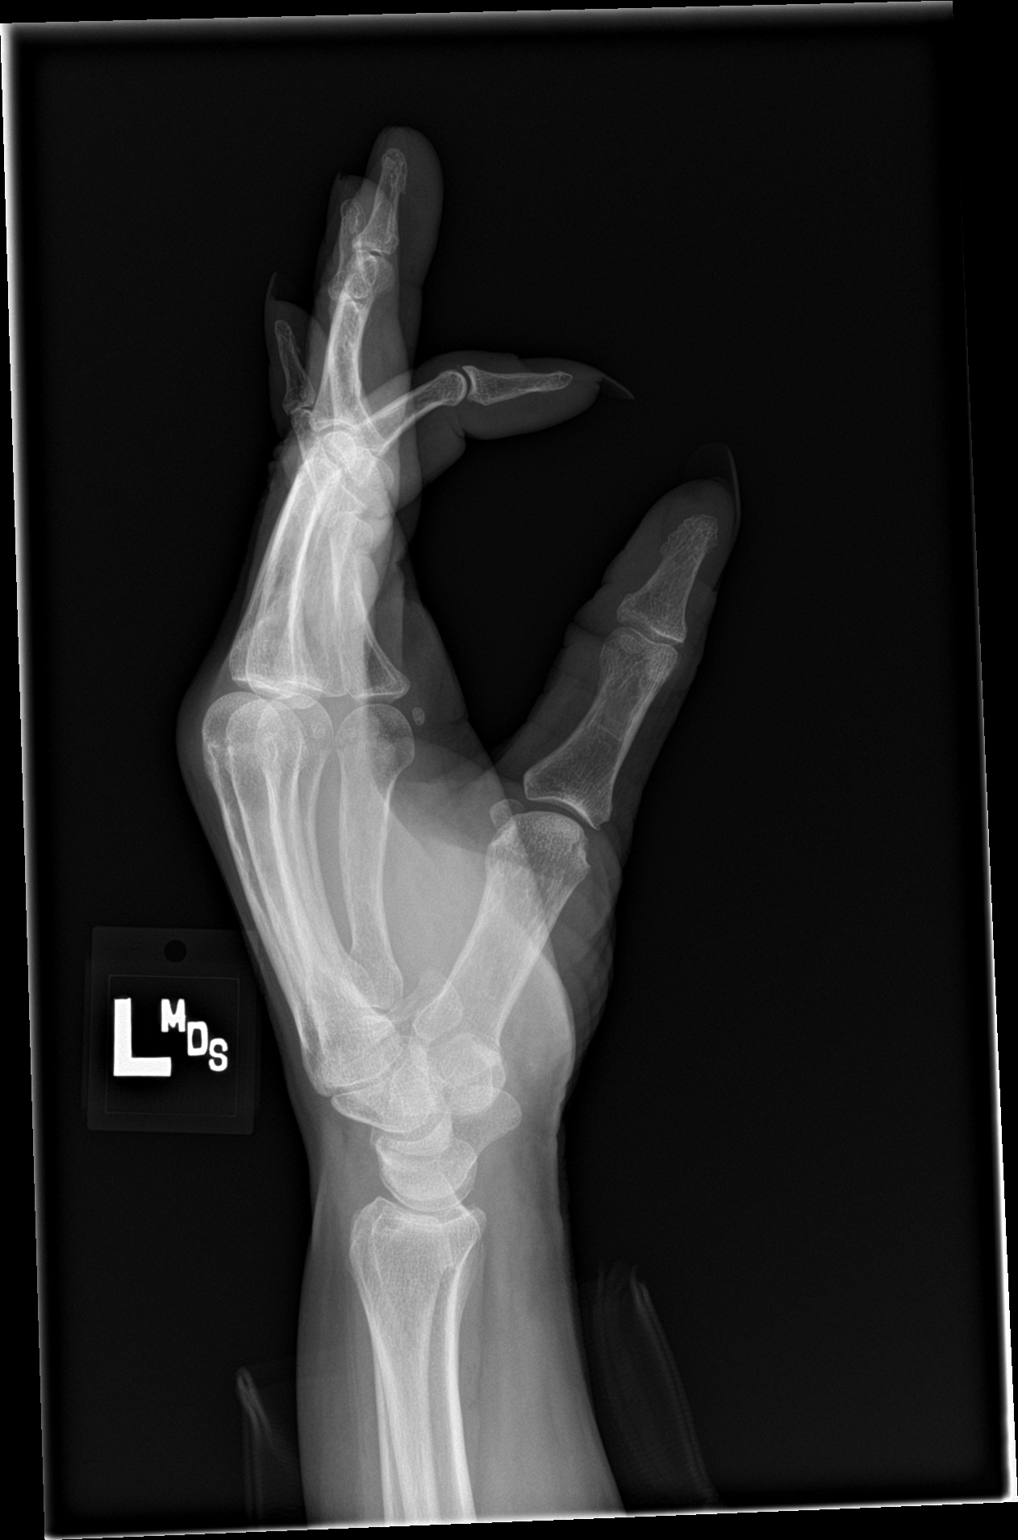

[3 of 3 positions shown; findings below may reference images not displayed]

FINDINGS: There is no evidence of fracture or dislocation. There is no
evidence of arthropathy or other focal bone abnormality. Soft
tissues are unremarkable.
IMPRESSION: Negative.

## 2022-09-15 NOTE — Progress Notes (Signed)
Please, let pt know that a mail with her screening results were sent to her. She will need additional imaging evaluation. If she did not schedule any appt, she needs to contact the center for diagnostic mammogram and US of the left breast.

## 2022-09-22 ENCOUNTER — Other Ambulatory Visit: Payer: 59

## 2022-09-22 ENCOUNTER — Inpatient Hospital Stay: Payer: 59

## 2022-09-28 NOTE — Therapy (Signed)
OUTPATIENT PHYSICAL THERAPY NEURO EVALUATION   Patient Name: Rebecca Yang MRN: [id number] DOB:1965-05-22, 57 y.o., female Today's Date: 09/28/2022   PCP: Was Dr. Mercy Moore but unsure who it is now per patient REFERRING PROVIDER: Morene Crocker, MD  END OF SESSION:   Past Medical History:  Diagnosis Date   Hyperlipidemia    Hypertension    Neuropathy    Sarcoidosis    Stroke [facility])    Past Surgical History:  Procedure Laterality Date   GALLBLADDER SURGERY     HAND SURGERY     SKIN SURGERY     Patient Active Problem List   Diagnosis Date Noted   Hyperlipidemia 06/15/2022   Hypertension associated with diabetes (HCC) 06/15/2022   Current every day smoker 06/15/2022   Type 2 diabetes mellitus without complications (HCC) 06/15/2022   Hemorrhagic cerebrovascular accident (CVA) (HCC) 07/10/2020   Sarcoidosis 07/09/2020   Intracranial mass 07/08/2020   COPD, moderate (HCC) 09/30/2013    ONSET DATE: 07/13/2022  REFERRING DIAG: Unsteady gait  THERAPY DIAG:  No diagnosis found.  Rationale for Evaluation and Treatment: Rehabilitation  SUBJECTIVE:                                                                                                                                                                                             SUBJECTIVE STATEMENT: Pt  reports that she had mini stroke about 2 years ago. She bounced back to PLOF after the stroke. But after 1year she started having issues with left leg and balance. Pt is currently not working and is unemployed.  Pt accompanied by: self  PERTINENT HISTORY: hx of CVA  PAIN:  Are you having pain? No  PRECAUTIONS: None  RED FLAGS: None   WEIGHT BEARING RESTRICTIONS: No  FALLS: Has patient fallen in last 6 months? No  LIVING ENVIRONMENT: Lives with:  Roommate who is older than hre Lives in: House/apartment Stairs: Yes: Internal: 11 steps; on left going up Has following equipment at home: None  PLOF:  Independent  PATIENT GOALS: strengthen my left arm and left leg  OBJECTIVE:   DIAGNOSTIC FINDINGS: 08/11/22 MRI of brain IMPRESSION: 1. No acute intracranial abnormality. 2. Sequela of remote hemorrhagic infarct in the right thalamus and bilateral basal ganglia. 3. Advanced for age chronic microvascular ischemic changes of the white matter.    COGNITION: Overall cognitive status: Within functional limits for tasks assessed   SENSATION: Pt reports constant numbness and tinglings in left hand.  LOWER EXTREMITY ROM:     Active  Right Eval Left Eval  Hip flexion    Hip extension    Hip  abduction    Hip adduction    Hip internal rotation    Hip external rotation    Knee flexion    Knee extension    Ankle dorsiflexion    Ankle plantarflexion    Ankle inversion    Ankle eversion     (Blank rows = not tested)  LOWER EXTREMITY MMT:    MMT Right Eval Left Eval  Hip flexion 5 4  Hip extension 4 3+  Hip abduction 5 3+  Hip adduction    Hip internal rotation    Hip external rotation    Knee flexion 5 5  Knee extension 5 5  Ankle dorsiflexion 5 5  Ankle plantarflexion 5 5  Ankle inversion    Ankle eversion    (Blank rows = not tested)  BED MOBILITY:  Sit to supine Complete Independence Supine to sit Complete Independence Rolling to Right Complete Independence Rolling to Left Complete Independence  TRANSFERS: Assistive device utilized: None  Sit to stand: Complete Independence Stand to sit: Complete Independence  STAIRS: Level of Assistance: Complete Independence Stair Negotiation Technique: Alternating Pattern  with Single Rail on Left Number of Stairs: 4  Height of Stairs: 6"    GAIT: Gait pattern: WFL Distance walked: 230' Assistive device utilized: None Level of assistance: Complete Independence  Single leg stance: R and L = 10 sec  FUNCTIONAL TESTS:  Functional gait assessment: 30/30   TODAY'S TREATMENT:                                                                                                                               DATE:  Exercises - Supine Active Straight Leg Raise  - 1 x daily - 7 x weekly - 2 sets - 10 reps - Sidelying Hip Abduction  - 1 x daily - 7 x weekly - 2 sets - 10 reps - Prone Hip Extension  - 1 x daily - 7 x weekly - 2 sets - 10 reps - Figure 4 Bridge  - 1 x daily - 7 x weekly - 2 sets - 10 reps - Supine Figure 4 Piriformis Stretch  - 1 x daily - 7 x weekly - 3 reps - 30 sec hold   Pt educated on sesson findings and her majority of issues may be coming from weakness in her left hip. PATIENT EDUCATION: Education details: see above Person educated: Patient Education method: Explanation Education comprehension: verbalized understanding  HOME EXERCISE PROGRAM: Access Code: [email or web address] URL: [email or web address] Date: 09/29/2022 Prepared by: Lavone Nian  Exercises - Supine Active Straight Leg Raise  - 1 x daily - 7 x weekly - 2 sets - 10 reps - Sidelying Hip Abduction  - 1 x daily - 7 x weekly - 2 sets - 10 reps - Prone Hip Extension  - 1 x daily - 7 x weekly - 2 sets - 10 reps - Figure 4 Bridge  - 1 x daily - 7 x  weekly - 2 sets - 10 reps - Supine Figure 4 Piriformis Stretch  - 1 x daily - 7 x weekly - 3 reps - 30 sec hold  GOALS: Goals reviewed with patient? No  SHORT TERM GOALS: Target date: 10/27/2022  Pt will be I and compliant with HEP to self manage her symptoms.  Baseline: Issued on 09/29/22 Goal status: INITIAL  LONG TERM GOALS: Target date: 11/24/2022   Pt will demo 5/5 strength with L hip flexion, abduction and extension to improve overall strength with functional trnasfers and walking.  Baseline: 3+/5 grossly (09/29/22) Goal status: INITIAL   ASSESSMENT:  CLINICAL IMPRESSION: Patient is a 57 y.o. female who was seen today for physical therapy evaluation and treatment for balance disorder. Upon objective testing, patient.   OBJECTIVE IMPAIRMENTS: decreased  activity tolerance and decreased strength.   ACTIVITY LIMITATIONS: standing and stairs  PARTICIPATION LIMITATIONS: cleaning, shopping, and community activity  PERSONAL FACTORS: Time since onset of injury/illness/exacerbation are also affecting patient's functional outcome.   REHAB POTENTIAL: Excellent  CLINICAL DECISION MAKING: Stable/uncomplicated  EVALUATION COMPLEXITY: Low  PLAN:  PT FREQUENCY: 2x/month  PT DURATION: 8 weeks  PLANNED INTERVENTIONS: Therapeutic exercises, Therapeutic activity, Neuromuscular re-education, Balance training, Gait training, Patient/Family education, Self Care, Joint mobilization, Orthotic/Fit training, Cryotherapy, Moist heat, Manual therapy, and Re-evaluation  PLAN FOR NEXT SESSION: Reassess MMT for any changes, reassess compliance and review HEP-update if needed   Ileana Ladd, PT 09/28/2022, 9:41 PM

## 2022-09-29 ENCOUNTER — Ambulatory Visit: Payer: 59 | Attending: Neurology

## 2022-09-29 ENCOUNTER — Other Ambulatory Visit: Payer: Self-pay

## 2022-09-29 DIAGNOSIS — M6281 Muscle weakness (generalized): Secondary | ICD-10-CM | POA: Insufficient documentation

## 2022-09-29 DIAGNOSIS — R2689 Other abnormalities of gait and mobility: Secondary | ICD-10-CM | POA: Insufficient documentation

## 2022-10-13 ENCOUNTER — Ambulatory Visit: Payer: 59 | Attending: Neurology

## 2022-10-13 DIAGNOSIS — R2689 Other abnormalities of gait and mobility: Secondary | ICD-10-CM | POA: Diagnosis present

## 2022-10-13 DIAGNOSIS — M6281 Muscle weakness (generalized): Secondary | ICD-10-CM | POA: Insufficient documentation

## 2022-10-13 NOTE — Therapy (Signed)
OUTPATIENT PHYSICAL THERAPY NEURO TREATMENT NOTE   Patient Name: Rebecca Yang MRN: [id number] DOB:[birth year], 57 y.o., female Today's Date: 10/13/2022   PCP: Was Dr. Mercy Moore but unsure who it is now per patient REFERRING PROVIDER: Morene Crocker, MD  END OF SESSION:  PT End of Session - 10/13/22 1314     Visit Number 2    Number of Visits 13    Date for PT Re-Evaluation 11/24/22    Authorization Type [facility] 2024 $125 copay No Ded [id number] OOP/ $0 Met VL: 30 (combined w/ OT)    Authorization Time Period `    PT Start Time 1315    PT Stop Time 1400    PT Time Calculation (min) 45 min    Equipment Utilized During Treatment Gait belt    Activity Tolerance Patient tolerated treatment well    Behavior During Therapy WFL for tasks assessed/performed             Past Medical History:  Diagnosis Date   Hyperlipidemia    Hypertension    Neuropathy    Sarcoidosis    Stroke [facility])    Past Surgical History:  Procedure Laterality Date   GALLBLADDER SURGERY     HAND SURGERY     SKIN SURGERY     Patient Active Problem List   Diagnosis Date Noted   Hyperlipidemia 06/15/2022   Hypertension associated with diabetes (HCC) 06/15/2022   Current every day smoker 06/15/2022   Type 2 diabetes mellitus without complications (HCC) 06/15/2022   Hemorrhagic cerebrovascular accident (CVA) (HCC) 07/10/2020   Sarcoidosis 07/09/2020   Intracranial mass 07/08/2020   COPD, moderate (HCC) 09/30/2013    ONSET DATE: 07/13/2022  REFERRING DIAG: Unsteady gait  THERAPY DIAG:  Other abnormalities of gait and mobility  Muscle weakness (generalized)  Rationale for Evaluation and Treatment: Rehabilitation  SUBJECTIVE:                                                                                                                                                                                             SUBJECTIVE STATEMENT: Pt  reports that she had mini stroke about 2 years ago.  She bounced back to PLOF after the stroke. But after 1year she started having issues with left leg and balance. Pt is currently not working and is unemployed.  Pt accompanied by: self  PERTINENT HISTORY: hx of CVA  PAIN:  Are you having pain? No  PRECAUTIONS: None  RED FLAGS: None   WEIGHT BEARING RESTRICTIONS: No  FALLS: Has patient fallen in last 6 months? No  LIVING ENVIRONMENT: Lives with:  Roommate who is  older than hre Lives in: House/apartment Stairs: Yes: Internal: 11 steps; on left going up Has following equipment at home: None  PLOF: Independent  PATIENT GOALS: strengthen my left arm and left leg  OBJECTIVE:   DIAGNOSTIC FINDINGS: 08/11/22 MRI of brain IMPRESSION: 1. No acute intracranial abnormality. 2. Sequela of remote hemorrhagic infarct in the right thalamus and bilateral basal ganglia. 3. Advanced for age chronic microvascular ischemic changes of the white matter.    COGNITION: Overall cognitive status: Within functional limits for tasks assessed   SENSATION: Pt reports constant numbness and tinglings in left hand.  LOWER EXTREMITY ROM:     Active  Right Eval Left Eval  Hip flexion    Hip extension    Hip abduction    Hip adduction    Hip internal rotation    Hip external rotation    Knee flexion    Knee extension    Ankle dorsiflexion    Ankle plantarflexion    Ankle inversion    Ankle eversion     (Blank rows = not tested)  LOWER EXTREMITY MMT:    MMT Right Eval Left Eval  Hip flexion 5 4  Hip extension 4 3+  Hip abduction 5 3+  Hip adduction    Hip internal rotation    Hip external rotation    Knee flexion 5 5  Knee extension 5 5  Ankle dorsiflexion 5 5  Ankle plantarflexion 5 5  Ankle inversion    Ankle eversion    (Blank rows = not tested)  BED MOBILITY:  Sit to supine Complete Independence Supine to sit Complete Independence Rolling to Right Complete Independence Rolling to Left Complete  Independence  TRANSFERS: Assistive device utilized: None  Sit to stand: Complete Independence Stand to sit: Complete Independence  STAIRS: Level of Assistance: Complete Independence Stair Negotiation Technique: Alternating Pattern  with Single Rail on Left Number of Stairs: 4  Height of Stairs: 6"    GAIT: Gait pattern: WFL Distance walked: 230' Assistive device utilized: None Level of assistance: Complete Independence  Single leg stance: R and L = 10 sec  FUNCTIONAL TESTS:  Functional gait assessment: 30/30   TODAY'S TREATMENT:                                                                                                                              DATE:  Supine SLR: 2 x 10 R and L, 3lbs SL hip abduction: 2 x 10 R and L, 3lbs Prone hip extensions: 2 x 10 R and L, 3lbs Unilateral figure 4 bridge: 2 x 10 R and L Supine bil toe taps: 2 x 10, 3lbs at bil ankles, core strengthening  Pt educated on sesson findings and her majority of issues may be coming from weakness in her left hip. PATIENT EDUCATION: Education details: see above Person educated: Patient Education method: Explanation Education comprehension: verbalized understanding  HOME EXERCISE PROGRAM: Access Code: [email or web address] URL: [email or web address] Date: 09/29/2022  Prepared by: Lavone Nian  Exercises - Supine Active Straight Leg Raise  - 1 x daily - 7 x weekly - 2 sets - 10 reps - Sidelying Hip Abduction  - 1 x daily - 7 x weekly - 2 sets - 10 reps - Prone Hip Extension  - 1 x daily - 7 x weekly - 2 sets - 10 reps - Figure 4 Bridge  - 1 x daily - 7 x weekly - 2 sets - 10 reps - Supine Figure 4 Piriformis Stretch  - 1 x daily - 7 x weekly - 3 reps - 30 sec hold  GOALS: Goals reviewed with patient? No  SHORT TERM GOALS: Target date: 10/27/2022  Pt will be I and compliant with HEP to self manage her symptoms.  Baseline: Issued on 09/29/22 Goal status: INITIAL  LONG TERM GOALS: Target  date: 11/24/2022   Pt will demo 5/5 strength with L hip flexion, abduction and extension to improve overall strength with functional trnasfers and walking.  Baseline: 3+/5 grossly (09/29/22) Goal status: INITIAL   ASSESSMENT:  CLINICAL IMPRESSION: Today's session focused on progressing her HEP with more resistance and reviewing HEP for compliance. Patient is reporting improving balance since last session. Pt reports fair compliance with HEP. Pt demonstrated slower pace of exercises for recovery.  OBJECTIVE IMPAIRMENTS: decreased activity tolerance and decreased strength.   ACTIVITY LIMITATIONS: standing and stairs  PARTICIPATION LIMITATIONS: cleaning, shopping, and community activity  PERSONAL FACTORS: Time since onset of injury/illness/exacerbation are also affecting patient's functional outcome.   REHAB POTENTIAL: Excellent  CLINICAL DECISION MAKING: Stable/uncomplicated  EVALUATION COMPLEXITY: Low  PLAN:  PT FREQUENCY: 2x/month  PT DURATION: 8 weeks  PLANNED INTERVENTIONS: Therapeutic exercises, Therapeutic activity, Neuromuscular re-education, Balance training, Gait training, Patient/Family education, Self Care, Joint mobilization, Orthotic/Fit training, Cryotherapy, Moist heat, Manual therapy, and Re-evaluation  PLAN FOR NEXT SESSION: Continue to review HEP and compliance, progress as necessary   Ileana Ladd, PT 10/13/2022, 1:29 PM

## 2022-10-26 ENCOUNTER — Ambulatory Visit: Payer: 59

## 2022-10-30 ENCOUNTER — Ambulatory Visit: Payer: 59

## 2023-04-04 ENCOUNTER — Ambulatory Visit: Payer: 59 | Admitting: Physician Assistant

## 2023-04-18 ENCOUNTER — Ambulatory Visit (INDEPENDENT_AMBULATORY_CARE_PROVIDER_SITE_OTHER): Payer: 59 | Admitting: Physician Assistant

## 2023-04-18 ENCOUNTER — Encounter: Payer: Self-pay | Admitting: Physician Assistant

## 2023-04-18 VITALS — BP 132/87 | HR 74 | Ht 64.0 in | Wt 148.0 lb

## 2023-04-18 DIAGNOSIS — E785 Hyperlipidemia, unspecified: Secondary | ICD-10-CM | POA: Diagnosis not present

## 2023-04-18 DIAGNOSIS — E1159 Type 2 diabetes mellitus with other circulatory complications: Secondary | ICD-10-CM | POA: Diagnosis not present

## 2023-04-18 DIAGNOSIS — E1169 Type 2 diabetes mellitus with other specified complication: Secondary | ICD-10-CM

## 2023-04-18 DIAGNOSIS — I152 Hypertension secondary to endocrine disorders: Secondary | ICD-10-CM | POA: Diagnosis not present

## 2023-04-18 DIAGNOSIS — R202 Paresthesia of skin: Secondary | ICD-10-CM

## 2023-04-18 DIAGNOSIS — Z720 Tobacco use: Secondary | ICD-10-CM

## 2023-04-18 DIAGNOSIS — R200 Anesthesia of skin: Secondary | ICD-10-CM | POA: Diagnosis not present

## 2023-04-18 DIAGNOSIS — Z716 Tobacco abuse counseling: Secondary | ICD-10-CM | POA: Diagnosis not present

## 2023-04-18 DIAGNOSIS — E119 Type 2 diabetes mellitus without complications: Secondary | ICD-10-CM | POA: Diagnosis not present

## 2023-04-18 DIAGNOSIS — Z5941 Food insecurity: Secondary | ICD-10-CM | POA: Diagnosis not present

## 2023-04-18 MED ORDER — NICOTINE POLACRILEX 2 MG MT GUM
2.0000 mg | CHEWING_GUM | OROMUCOSAL | 0 refills | Status: DC | PRN
Start: 2023-04-18 — End: 2023-08-30

## 2023-04-18 NOTE — Progress Notes (Signed)
 Established patient visit  Patient: Rebecca Yang   DOB: 24-Feb-1966   58 y.o. Female  MRN: [id number] Visit Date: 04/18/2023  Today's healthcare provider: Destynie Toomey, PA-C   Chief Complaint  Patient presents with   Medical Management of Chronic Issues    Patient reports taking medications as prescribed and ran out 2 days ago. Does not believe she needs them    Hypertension    Does not check, no monitor at home   Hyperlipidemia   Diabetes    Patient does not check sugar as she does not have a meter. Unsure of last eye exam and due for a foot exam   Immunizations    Shingles, Pneumonia and Influenza declined   Care Management    Please review patient SDOH   Subjective      Discussed the use of AI scribe software for clinical note transcription with the patient, who gave verbal consent to proceed.  History of Present Illness   The patient, with a history of hypertension, hyperlipidemia, and diabetes, presents today after running out of her medications two days ago. She reports she has been unable to afford her medications, which include amlodipine , atorvastatin , losartan , and metformin . She also has been prescribed nicotine  patches, indicating a history of smoking.  In addition to her chronic conditions, the patient reports experiencing numbness and tingling in her arm and leg. She has seen a neurosurgeon for these symptoms and has undergone physical therapy. She denies any chest pain, shortness of breath, or rapid heart beating. She also denies any problems with bowel movements, urination, or leg swelling.           07/17/2022    1:25 PM 06/13/2022    1:44 PM 02/28/2022    1:56 PM  Depression screen PHQ 2/9  Decreased Interest 0 0 0  Down, Depressed, Hopeless 1 1 0  PHQ - 2 Score 1 1 0  Altered sleeping 2 1 0  Tired, decreased energy 2 1 0  Change in appetite 0 1 0  Feeling bad or failure about yourself  0 0 0  Trouble concentrating 0 0 0  Moving slowly or  fidgety/restless 0 0 0  Suicidal thoughts 0 0 0  PHQ-9 Score 5 4 0  Difficult doing work/chores Not difficult at all Not difficult at all Not difficult at all       No data to display          Medications: Outpatient Medications Prior to Visit  Medication Sig   [DISCONTINUED] nicotine  polacrilex (NICORETTE ) 2 MG gum Take 1 each (2 mg total) by mouth as needed for smoking cessation.   acetaminophen  (TYLENOL ) 325 MG tablet Take 650 mg by mouth every 6 (six) hours as needed. (Patient not taking: Reported on 04/18/2023)   amLODipine  (NORVASC ) 10 MG tablet Take 1 tablet by mouth once daily (Patient not taking: Reported on 04/18/2023)   atorvastatin  (LIPITOR) 40 MG tablet Take 1 tablet (40 mg total) by mouth daily. (Patient not taking: Reported on 04/18/2023)   losartan  (COZAAR ) 25 MG tablet Take 1 tablet (25 mg total) by mouth daily. (Patient not taking: Reported on 04/18/2023)   metFORMIN  (GLUCOPHAGE ) 500 MG tablet Take 1 tablet (500 mg total) by mouth daily with breakfast. (Patient not taking: Reported on 04/18/2023)   No facility-administered medications prior to visit.    Review of Systems All negative Except see HPI       Objective    BP 132/87 (BP Location: Right Arm,  Patient Position: Sitting, Cuff Size: Normal)   Pulse 74   Ht 5' 4 (1.626 m)   Wt 148 lb (67.1 kg)   LMP 01/23/2016 (Approximate)   BMI 25.40 kg/m     Physical Exam Vitals reviewed.  Constitutional:      General: She is not in acute distress.    Appearance: Normal appearance. She is well-developed. She is not diaphoretic.  HENT:     Head: Normocephalic and atraumatic.  Eyes:     General: No scleral icterus.    Conjunctiva/sclera: Conjunctivae normal.  Neck:     Thyroid : No thyromegaly.  Cardiovascular:     Rate and Rhythm: Normal rate and regular rhythm.     Pulses: Normal pulses.     Heart sounds: Normal heart sounds. No murmur heard. Pulmonary:     Effort: Pulmonary effort is normal. No respiratory  distress.     Breath sounds: Normal breath sounds. No wheezing, rhonchi or rales.  Musculoskeletal:     Cervical back: Neck supple.     Right lower leg: No edema.     Left lower leg: No edema.  Lymphadenopathy:     Cervical: No cervical adenopathy.  Skin:    General: Skin is warm and dry.     Findings: No rash.  Neurological:     Mental Status: She is alert and oriented to person, place, and time. Mental status is at baseline.  Psychiatric:        Mood and Affect: Mood normal.        Behavior: Behavior normal.      No results found for any visits on 04/18/23.      Assessment and Plan    Hypertension Uncontrolled, patient has been off amlodipine  and losartan  due to financial constraints. -Refer to child psychotherapist for assistance with medication costs. -Resume amlodipine  10mg  and losartan  as previously prescribed but will wait until her lab results will be back.  Hyperlipidemia chronic  ordered LP Patient has been off atorvastatin  due to financial constraints. -Resume atorvastatin  as previously prescribed.  Diabetes chronic last a1c was 6.5 needs to update a1c. her last last visit was a few mo ago. Patient has been off metformin  due to financial constraints. -Resume metformin  as previously prescribed.  Peripheral Neuropathy Ongoing tingling and numbness in the leg and arm. -Continue to follow up with neurosurgeon as previously advised. fu with physical therapy  Tobacco Use Patient smokes 4-5 cigarettes per day for the past 20 years. -Prescribe nicotine  gum to assist with smoking cessation.  Depression Patient reports mild depression. -Consider mental health referral if symptoms persist or worsen.  General Health Maintenance -Order labs including BMP, TSH, A1C, and lipids. -Follow up in 6 weeks to review lab results and assess medication adherence.    Nicotine  use  - nicotine  polacrilex (NICORETTE ) 2 MG gum; Take 1 each (2 mg total) by mouth as needed for  smoking cessation.  Dispense: 100 tablet; Refill: 0  Food insecurity (Primary)  - AMB Referral [facility] Care Management   Orders Placed This Encounter  Procedures   Hemoglobin A1c   Lipid panel    Has the patient fasted?:   Yes   TSH   Basic metabolic panel    Has the patient fasted?:   Yes   AMB Referral [facility]BCI Care Management    Referral Priority:   Routine    Referral Type:   Consultation    Referral Reason:   Care Coordination    Number of Visits Requested:  1    Return in about 6 weeks (around 05/30/2023) for chronic disease f/u.   The patient was advised to call back or seek an in-person evaluation if the symptoms worsen or if the condition fails to improve as anticipated.  I discussed the assessment and treatment plan with the patient. The patient was provided an opportunity to ask questions and all were answered. The patient agreed with the plan and demonstrated an understanding of the instructions.  I, Vega Withrow, PA-C have reviewed all documentation for this visit. The documentation on 04/18/2023  for the exam, diagnosis, procedures, and orders are all accurate and complete.  Jolynn Spencer, [facility], MMS [facility] [phone] (phone) [phone] (fax)  [facility]

## 2023-04-19 ENCOUNTER — Telehealth: Payer: Self-pay | Admitting: *Deleted

## 2023-04-19 LAB — LIPID PANEL
Chol/HDL Ratio: 3.6 {ratio} (ref 0.0–4.4)
Cholesterol, Total: 207 mg/dL — ABNORMAL HIGH (ref 100–199)
HDL: 57 mg/dL (ref >39)
LDL Chol Calc (NIH): 127 mg/dL — ABNORMAL HIGH (ref 0–99)
Triglycerides: 127 mg/dL (ref 0–149)
VLDL Cholesterol Cal: 23 mg/dL (ref 5–40)

## 2023-04-19 LAB — BASIC METABOLIC PANEL
BUN/Creatinine Ratio: 15 (ref 9–23)
BUN: 15 mg/dL (ref 6–24)
CO2: 20 mmol/L (ref 20–29)
Calcium: 9.4 mg/dL (ref 8.7–10.2)
Chloride: 107 mmol/L — ABNORMAL HIGH (ref 96–106)
Creatinine, Ser: 1 mg/dL (ref 0.57–1.00)
Glucose: 90 mg/dL (ref 70–99)
Potassium: 4 mmol/L (ref 3.5–5.2)
Sodium: 140 mmol/L (ref 134–144)
eGFR: 65 mL/min/{1.73_m2} (ref >59)

## 2023-04-19 LAB — HEMOGLOBIN A1C
Est. average glucose Bld gHb Est-mCnc: 126 mg/dL
Hgb A1c MFr Bld: 6 % — ABNORMAL HIGH (ref 4.8–5.6)

## 2023-04-19 LAB — TSH: TSH: 2.12 u[IU]/mL (ref 0.450–4.500)

## 2023-04-19 NOTE — Progress Notes (Signed)
 Complex Care Management Note Care Guide Note  04/19/2023 Name: [name] MRN: [id number] DOB: January 18, 1966   Complex Care Management Outreach Attempts: An unsuccessful telephone outreach was attempted today to offer the patient information about available complex care management services.  Follow Up Plan:  Additional outreach attempts will be made to offer the patient complex care management information and services.   Encounter Outcome:  No Answer  Thedford Franks, CMA, Care Guide [facility]  [facility], [facility] Guide Direct Dial: [phone]  Fax: [phone] Website: [email or web address]

## 2023-04-20 NOTE — Progress Notes (Signed)
 Please , let pt know that her A1c is still elevated. Advised to continue taking metformin . She continue to have elevated cholesterol. Needs to continue taking atorvastatin  If she will not be able to schedule an appointment with social worker next week, she will need to contact us  by the end of th next week

## 2023-04-23 ENCOUNTER — Telehealth: Payer: Self-pay

## 2023-04-23 ENCOUNTER — Ambulatory Visit: Payer: Self-pay

## 2023-04-23 NOTE — Patient Outreach (Signed)
  Care Coordination   04/23/2023 Name: Rebecca Yang MRN: [id number] DOB: May 03, 1965   Care Coordination Outreach Attempts:  An unsuccessful outreach was attempted for an appointment today.  Follow Up Plan:  Additional outreach attempts will be made to offer the patient complex care management information and services.   Encounter Outcome:  No Answer   Care Coordination Interventions:  No, not indicated    Dallis Dues, BSW   [facility], [facility] Social Worker Direct Dial: [phone]  Fax: [phone] Website: [email or web address]

## 2023-04-23 NOTE — Telephone Encounter (Signed)
Left message for patient to call back to discuss results and recommendations. °

## 2023-04-23 NOTE — Telephone Encounter (Signed)
-----   Message from Janna Ostwalt sent at 04/20/2023  7:19 AM EST ----- Please , let pt know that her A1c is still elevated. Advised to continue taking metformin . She continue to have elevated cholesterol. Needs to continue taking atorvastatin  If she will not be able to schedule an appointment with social worker next week, she will need to contact us  by the end of th next week

## 2023-04-27 ENCOUNTER — Other Ambulatory Visit: Payer: Self-pay | Admitting: Physician Assistant

## 2023-04-27 DIAGNOSIS — I152 Hypertension secondary to endocrine disorders: Secondary | ICD-10-CM

## 2023-04-30 NOTE — Telephone Encounter (Signed)
 Requested Prescriptions  Pending Prescriptions Disp Refills   amLODipine (NORVASC) 10 MG tablet [Pharmacy Med Name: amLODIPine Besylate 10 MG Oral Tablet] 90 tablet 0    Sig: Take 1 tablet by mouth once daily     Cardiovascular: Calcium Channel Blockers 2 Failed - 04/30/2023 10:22 AM      Failed - Valid encounter within last 6 months    Recent Outpatient Visits           9 months ago Annual physical exam   [facility] [place], [place], PA-C   10 months ago Hypertension associated with diabetes [facility])   [facility] [place], [place], PA-C   1 year ago Hyperlipidemia, unspecified hyperlipidemia type   [facility] [place], [place], PA-C   1 year ago Hypertension associated with diabetes [facility])   [facility] [place], [place], PA-C       Future Appointments             In 1 month Ostwalt, [place], PA-C [facility]            Passed - Last BP in normal range    BP Readings from Last 1 Encounters:  04/18/23 132/87         Passed - Last Heart Rate in normal range    Pulse Readings from Last 1 Encounters:  04/18/23 74

## 2023-05-01 NOTE — Progress Notes (Signed)
 Complex Care Management Care Guide Note  05/01/2023 Name: Rebecca Yang MRN: [id number] DOB: [birth year]  Rebecca Yang is a 58 y.o. year old female who is a primary care patient of Debera Lat, Cordelia Poche and is actively engaged with the care management team. I reached out to Paul Half by phone today to assist with re-scheduling  with the BSW.  Follow up plan: Telephone appointment with complex care management team member scheduled for:  05/08/2023  Burman Nieves, CMA, Care Guide [facility], [facility] Guide Direct Dial: [phone]  Fax: [phone] Website: [email or web address]

## 2023-05-08 ENCOUNTER — Ambulatory Visit: Payer: Self-pay

## 2023-05-08 NOTE — Patient Outreach (Signed)
 Care Coordination   Initial Visit Note   05/08/2023 Name: Rebecca Yang MRN: [id number] DOB: October 28, 1965  Rebecca Yang is a 58 y.o. year old female who sees [place], [place], [place] for primary care. I spoke with  Paul Half by phone today.  What matters to the patients health and wellness today?  Patient needs financial assistance for daily needs. Patient has lived with a friend for 10 years. Patient has no income as she awaits an appeal from [place].  Patient has family in [place] but they are limited on assistance.    Goals Addressed             This Visit's Progress    Care Coordination Activities       Interventions Today    Flowsheet Row Most Recent Value  Chronic Disease   Chronic disease during today's visit Diabetes, Chronic Obstructive Pulmonary Disease (COPD), Hypertension (HTN), Other  [Stroke]  General Interventions   General Interventions Discussed/Reviewed General Interventions Discussed, General Interventions Reviewed, [employer], Communication with  [Pt can't work due to stroke, applying for SSI and lives w/a friend.Need copay and will ask family for $25.Pt gets foodstamps and will contact [email or web address] for delivery (and for rx delivery).Referred to [facility],Voc Rehab,Disability Advocacy,DSS friend bills.]  Communication with --  [SW t/c [employer] for extra benefits. Pt is enrolled in [employer] and [employer] pass.Pt informed of transportation w/[employer] and $100 refund for vitamins and health options.]              SDOH assessments and interventions completed:  Yes  SDOH Interventions Today    Flowsheet Row Most Recent Value  SDOH Interventions   Food Insecurity Interventions Intervention Not Indicated  [Foodstamps]  Housing Interventions Intervention Not Indicated  [Lives with a friend for past 10 years]  Transportation Interventions Other (Comment)  [Use Medicaid transportation for doctor visits, refer to [employer] and online grocery/medication delivery]   Utilities Interventions [employer] Provided  [Friend referred to DSS for assistance]        Care Coordination Interventions:  Yes, provided   Follow up plan: Follow up call scheduled for 06/05/23 at 1pm.    Encounter Outcome:  Patient Visit Completed

## 2023-05-08 NOTE — Patient Instructions (Signed)
 Visit Information  Thank you for taking time to visit with me today. Please don't hesitate to contact me if I can be of assistance to you.   Following are the goals we discussed today:  Patient will contact family for assistance with medication copays. Patient will contact Disability Advocacy for help with SSI appeal. Patient will contact [employer] for rx and food delivery. Patient will contact [employer] for transportation. Patient will contact Vocational Rehabilitation if disability is denied for employment options.    Our next appointment is by telephone on 06/05/23 at 1pm  Please call the care guide team at [phone] if you need to cancel or reschedule your appointment.   If you are experiencing a Mental Health or Behavioral Health Crisis or need someone to talk to, please call 911  Patient verbalizes understanding of instructions and care plan provided today and agrees to view in MyChart. Active MyChart status and patient understanding of how to access instructions and care plan via MyChart confirmed with patient.     Telephone follow up appointment with care management team member scheduled for:06/05/23 at 1pm.  Lysle Morales, BSW   [facility], [facility] Social Worker Direct Dial: [phone]  Fax: [phone] Website: [email or web address]

## 2023-05-27 NOTE — Progress Notes (Unsigned)
 Established patient visit  Patient: Rebecca Yang   DOB: May 31, 1965   58 y.o. Female  MRN: [id number] Visit Date: 05/30/2023  Today's healthcare provider: Debera Lat, PA-C   No chief complaint on file.  Subjective       Discussed the use of AI scribe software for clinical note transcription with the patient, who gave verbal consent to proceed.  History of Present Illness               07/17/2022    1:25 PM 06/13/2022    1:44 PM 02/28/2022    1:56 PM  Depression screen PHQ 2/9  Decreased Interest 0 0 0  Down, Depressed, Hopeless 1 1 0  PHQ - 2 Score 1 1 0  Altered sleeping 2 1 0  Tired, decreased energy 2 1 0  Change in appetite 0 1 0  Feeling bad or failure about yourself  0 0 0  Trouble concentrating 0 0 0  Moving slowly or fidgety/restless 0 0 0  Suicidal thoughts 0 0 0  PHQ-9 Score 5 4 0  Difficult doing work/chores Not difficult at all Not difficult at all Not difficult at all       No data to display          Medications: Outpatient Medications Prior to Visit  Medication Sig  . acetaminophen (TYLENOL) 325 MG tablet Take 650 mg by mouth every 6 (six) hours as needed. (Patient not taking: Reported on 04/18/2023)  . amLODipine (NORVASC) 10 MG tablet Take 1 tablet by mouth once daily  . atorvastatin (LIPITOR) 40 MG tablet Take 1 tablet (40 mg total) by mouth daily. (Patient not taking: Reported on 04/18/2023)  . losartan (COZAAR) 25 MG tablet Take 1 tablet (25 mg total) by mouth daily. (Patient not taking: Reported on 04/18/2023)  . metFORMIN (GLUCOPHAGE) 500 MG tablet Take 1 tablet (500 mg total) by mouth daily with breakfast. (Patient not taking: Reported on 04/18/2023)  . nicotine polacrilex (NICORETTE) 2 MG gum Take 1 each (2 mg total) by mouth as needed for smoking cessation.   No facility-administered medications prior to visit.    Review of Systems  All other systems reviewed and are negative. All negative Except see HPI   {Insert previous labs  (optional):23779} {See past labs  Heme  Chem  Endocrine  Serology  Results Review (optional):1}   Objective    LMP 01/23/2016 (Approximate)  {Insert last BP/Wt (optional):23777}{See vitals history (optional):1}   Physical Exam Vitals reviewed.  Constitutional:      General: She is not in acute distress.    Appearance: Normal appearance. She is well-developed. She is not diaphoretic.  HENT:     Head: Normocephalic and atraumatic.  Eyes:     General: No scleral icterus.    Conjunctiva/sclera: Conjunctivae normal.  Neck:     Thyroid: No thyromegaly.  Cardiovascular:     Rate and Rhythm: Normal rate and regular rhythm.     Pulses: Normal pulses.     Heart sounds: Normal heart sounds. No murmur heard. Pulmonary:     Effort: Pulmonary effort is normal. No respiratory distress.     Breath sounds: Normal breath sounds. No wheezing, rhonchi or rales.  Musculoskeletal:     Cervical back: Neck supple.     Right lower leg: No edema.     Left lower leg: No edema.  Lymphadenopathy:     Cervical: No cervical adenopathy.  Skin:    General: Skin is warm and dry.  Findings: No rash.  Neurological:     Mental Status: She is alert and oriented to person, place, and time. Mental status is at baseline.  Psychiatric:        Mood and Affect: Mood normal.        Behavior: Behavior normal.     No results found for any visits on 05/30/23.      Assessment and Plan             No orders of the defined types were placed in this encounter.   No follow-ups on file.   The patient was advised to call back or seek an in-person evaluation if the symptoms worsen or if the condition fails to improve as anticipated.  I discussed the assessment and treatment plan with the patient. The patient was provided an opportunity to ask questions and all were answered. The patient agreed with the plan and demonstrated an understanding of the instructions.  I, Debera Lat, PA-C have reviewed all  documentation for this visit. The documentation on 05/30/2023  for the exam, diagnosis, procedures, and orders are all accurate and complete.  Debera Lat, [facility], MMS [facility] [phone] (phone) [phone] (fax)  [facility]

## 2023-05-30 ENCOUNTER — Ambulatory Visit (INDEPENDENT_AMBULATORY_CARE_PROVIDER_SITE_OTHER): Payer: 59 | Admitting: Physician Assistant

## 2023-05-30 ENCOUNTER — Encounter: Payer: Self-pay | Admitting: Physician Assistant

## 2023-05-30 VITALS — BP 107/83 | HR 93 | Temp 98.1°F | Resp 16 | Ht 64.0 in | Wt 140.6 lb

## 2023-05-30 DIAGNOSIS — I152 Hypertension secondary to endocrine disorders: Secondary | ICD-10-CM

## 2023-05-30 DIAGNOSIS — F339 Major depressive disorder, recurrent, unspecified: Secondary | ICD-10-CM

## 2023-05-30 DIAGNOSIS — E1159 Type 2 diabetes mellitus with other circulatory complications: Secondary | ICD-10-CM

## 2023-05-30 DIAGNOSIS — E785 Hyperlipidemia, unspecified: Secondary | ICD-10-CM

## 2023-05-30 DIAGNOSIS — Z5986 Financial insecurity: Secondary | ICD-10-CM

## 2023-05-30 DIAGNOSIS — Z7984 Long term (current) use of oral hypoglycemic drugs: Secondary | ICD-10-CM

## 2023-05-30 DIAGNOSIS — E119 Type 2 diabetes mellitus without complications: Secondary | ICD-10-CM

## 2023-05-30 DIAGNOSIS — R200 Anesthesia of skin: Secondary | ICD-10-CM | POA: Diagnosis not present

## 2023-05-30 DIAGNOSIS — Z716 Tobacco abuse counseling: Secondary | ICD-10-CM

## 2023-05-30 DIAGNOSIS — R202 Paresthesia of skin: Secondary | ICD-10-CM

## 2023-05-30 DIAGNOSIS — J449 Chronic obstructive pulmonary disease, unspecified: Secondary | ICD-10-CM

## 2023-05-30 DIAGNOSIS — D508 Other iron deficiency anemias: Secondary | ICD-10-CM

## 2023-05-30 DIAGNOSIS — Z5941 Food insecurity: Secondary | ICD-10-CM

## 2023-05-30 DIAGNOSIS — Z8673 Personal history of transient ischemic attack (TIA), and cerebral infarction without residual deficits: Secondary | ICD-10-CM

## 2023-06-05 ENCOUNTER — Ambulatory Visit: Payer: Self-pay

## 2023-06-05 NOTE — Patient Instructions (Signed)
 Visit Information  Thank you for taking time to visit with me today. Please don't hesitate to contact me if I can be of assistance to you.   Following are the goals we discussed today:  Patient will contact [facility] for support with disability claim. Patient will revisit [employer] online ordering for food.   Our next appointment is by telephone on 06/08/23 at 10am  Please call the care guide team at [phone] if you need to cancel or reschedule your appointment.   If you are experiencing a Mental Health or Behavioral Health Crisis or need someone to talk to, please call 911  Patient verbalizes understanding of instructions and care plan provided today and agrees to view in MyChart. Active MyChart status and patient understanding of how to access instructions and care plan via MyChart confirmed with patient.     Telephone follow up appointment with care management team member scheduled for:06/08/23 at 10am.   Lysle Morales, BSW   [facility], [facility] Social Worker Direct Dial: [phone]  Fax: [phone] Website: [email or web address]

## 2023-06-05 NOTE — Patient Outreach (Signed)
 Care Coordination   Follow Up Visit Note   06/05/2023 Name: LAILA MYHRE MRN: [id number] DOB: Jul 14, 1965  WESTLYNN FIFER is a 58 y.o. year old female who sees [place], [place], [place] for primary care. I spoke with  Paul Half by phone today.  What matters to the patients health and wellness today?  Patient needs support for disability appeal.    Goals Addressed               This Visit's Progress     Care Coordination Activities (pt-stated)        Interventions Today    Flowsheet Row Most Recent Value  Chronic Disease   Chronic disease during today's visit Diabetes, Chronic Obstructive Pulmonary Disease (COPD), Hypertension (HTN)  General Interventions   General Interventions Discussed/Reviewed General Interventions Discussed, General Interventions Reviewed, [employer]  [Pt signed paperwork to work w/[employer].Pt isn't familiar w/the company & online has mixed reviews.Pt agreed to contact [facility].Pt used [facility] for transportation.Pt will revisit [employer] online ordering.SSA appeal 3/31.Family sending funds for copay]              SDOH assessments and interventions completed:  No     Care Coordination Interventions:  Yes, provided   Follow up plan: Follow up call scheduled for 06/08/23 at 10am    Encounter Outcome:  Patient Visit Completed

## 2023-06-08 ENCOUNTER — Ambulatory Visit: Payer: Self-pay

## 2023-06-08 NOTE — Patient Instructions (Signed)
 Visit Information  Thank you for taking time to visit with me today. Please don't hesitate to contact me if I can be of assistance to you.   Following are the goals we discussed today:  Patient to contact [employer] to confirm if appeal is virtual and in person. Patient will follow up with Disability Advocacy if no response next week.   Our next appointment is by telephone on 06/19/23 at 1:30pm with Gus Puma.  Please call the care guide team at [phone] if you need to cancel or reschedule your appointment.   If you are experiencing a Mental Health or Behavioral Health Crisis or need someone to talk to, please call 911  Patient verbalizes understanding of instructions and care plan provided today and agrees to view in MyChart. Active MyChart status and patient understanding of how to access instructions and care plan via MyChart confirmed with patient.     Telephone follow up appointment with care management team member scheduled for:  06/19/23 at 1:30 with Gus Puma.  Lysle Morales, BSW [place]  [facility], [facility] Social Worker Direct Dial: [phone]  Fax: [phone] Website: [email or web address]

## 2023-06-08 NOTE — Patient Outreach (Signed)
 Care Coordination   Follow Up Visit Note   06/08/2023 Name: Rebecca Yang MRN: [id number] DOB: 1965-11-07  Rebecca Yang is a 58 y.o. year old female who sees [place], [place], [place] for primary care. I spoke with  Paul Half by phone today.  What matters to the patients health and wellness today?  Patient needs assistance with disability claim.    Goals Addressed               This Visit's Progress     Care Coordination Activities (pt-stated)        Interventions Today    Flowsheet Row Most Recent Value  Chronic Disease   Chronic disease during today's visit Diabetes, Chronic Obstructive Pulmonary Disease (COPD), Hypertension (HTN)  General Interventions   General Interventions Discussed/Reviewed General Interventions Discussed, General Interventions Reviewed, [employer] did not receive a return call from [employer].SSI appeal is Monday but pt is not sure if it is virtual or in person.Pt will call to confirm because she does not have transportation arranged.Can't use EBT for [employer] online order.Pt has food.]              SDOH assessments and interventions completed:  No     Care Coordination Interventions:  Yes, provided   Follow up plan: Follow up call scheduled for 06/19/23 at 1:30 with Gus Puma    Encounter Outcome:  Patient Visit Completed

## 2023-06-19 ENCOUNTER — Other Ambulatory Visit: Payer: Self-pay

## 2023-07-12 ENCOUNTER — Telehealth: Payer: Self-pay | Admitting: *Deleted

## 2023-07-12 NOTE — Progress Notes (Signed)
 Complex Care Management Care Guide Note  07/12/2023 Name: Rebecca Yang MRN: [id number] DOB: Aug 18, 1965  Rebecca Yang is a 58 y.o. year old female who is a primary care patient of Ostwalt, Janna, PA-C and is actively engaged with the care management team. I reached out to Lena Qualia by phone today to assist with re-scheduling  with the BSW.  Follow up plan: Unsuccessful telephone outreach attempt made. A HIPAA compliant phone message was left for the patient providing contact information and requesting a return call.  Barnie Bora  [facility]  [facility], [facility] Guide  Direct Dial: [phone]  Fax [phone]

## 2023-07-20 NOTE — Progress Notes (Signed)
 Complex Care Management Care Guide Note  07/20/2023 Name: Rebecca Yang MRN: [id number] DOB: 1965-11-11  [name] is a 58 y.o. year old female who is a primary care patient of Ostwalt, Janna, PA-C and is actively engaged with the care management team. I reached out to Lena Qualia by phone today to assist with re-scheduling  with the BSW.  Follow up plan: Unsuccessful telephone outreach attempt made. No further outreach attempts will be made at this time. We have been unable to contact the patient to reschedule for complex care management services.   Barnie Bora  [facility]  Value-Based Care Institute, [facility] Guide  Direct Dial: [phone]  Fax [phone]

## 2023-07-23 ENCOUNTER — Emergency Department

## 2023-07-23 ENCOUNTER — Other Ambulatory Visit: Payer: Self-pay

## 2023-07-23 ENCOUNTER — Observation Stay
Admission: EM | Admit: 2023-07-23 | Discharge: 2023-07-26 | Disposition: A | Discharge status: Home / Self Care | Attending: Family Medicine | Admitting: Family Medicine

## 2023-07-23 DIAGNOSIS — I639 Cerebral infarction, unspecified: Secondary | ICD-10-CM | POA: Diagnosis not present

## 2023-07-23 DIAGNOSIS — R42 Dizziness and giddiness: Secondary | ICD-10-CM | POA: Diagnosis present

## 2023-07-23 DIAGNOSIS — D869 Sarcoidosis, unspecified: Secondary | ICD-10-CM | POA: Insufficient documentation

## 2023-07-23 DIAGNOSIS — I6381 Other cerebral infarction due to occlusion or stenosis of small artery: Secondary | ICD-10-CM | POA: Diagnosis not present

## 2023-07-23 DIAGNOSIS — Z79899 Other long term (current) drug therapy: Secondary | ICD-10-CM | POA: Diagnosis not present

## 2023-07-23 DIAGNOSIS — E876 Hypokalemia: Secondary | ICD-10-CM | POA: Diagnosis not present

## 2023-07-23 DIAGNOSIS — F17210 Nicotine dependence, cigarettes, uncomplicated: Secondary | ICD-10-CM | POA: Insufficient documentation

## 2023-07-23 DIAGNOSIS — H532 Diplopia: Secondary | ICD-10-CM | POA: Diagnosis not present

## 2023-07-23 DIAGNOSIS — R946 Abnormal results of thyroid function studies: Secondary | ICD-10-CM | POA: Insufficient documentation

## 2023-07-23 DIAGNOSIS — I152 Hypertension secondary to endocrine disorders: Secondary | ICD-10-CM

## 2023-07-23 DIAGNOSIS — E1159 Type 2 diabetes mellitus with other circulatory complications: Secondary | ICD-10-CM

## 2023-07-23 DIAGNOSIS — I110 Hypertensive heart disease with heart failure: Secondary | ICD-10-CM | POA: Diagnosis not present

## 2023-07-23 DIAGNOSIS — H538 Other visual disturbances: Secondary | ICD-10-CM

## 2023-07-23 DIAGNOSIS — Z7984 Long term (current) use of oral hypoglycemic drugs: Secondary | ICD-10-CM | POA: Insufficient documentation

## 2023-07-23 DIAGNOSIS — I5030 Unspecified diastolic (congestive) heart failure: Secondary | ICD-10-CM | POA: Insufficient documentation

## 2023-07-23 DIAGNOSIS — I69359 Hemiplegia and hemiparesis following cerebral infarction affecting unspecified side: Secondary | ICD-10-CM

## 2023-07-23 LAB — CBC
HCT: 36.3 % (ref 36.0–46.0)
Hemoglobin: 12.3 g/dL (ref 12.0–15.0)
MCH: 31.1 pg (ref 26.0–34.0)
MCHC: 33.9 g/dL (ref 30.0–36.0)
MCV: 91.9 fL (ref 80.0–100.0)
Platelets: 186 10*3/uL (ref 150–400)
RBC: 3.95 MIL/uL (ref 3.87–5.11)
RDW: 13.2 % (ref 11.5–15.5)
WBC: 5.8 10*3/uL (ref 4.0–10.5)
nRBC: 0 % (ref 0.0–0.2)

## 2023-07-23 LAB — URINALYSIS, ROUTINE W REFLEX MICROSCOPIC
Bilirubin Urine: NEGATIVE
Glucose, UA: NEGATIVE mg/dL
Hgb urine dipstick: NEGATIVE
Ketones, ur: NEGATIVE mg/dL
Leukocytes,Ua: NEGATIVE
Nitrite: NEGATIVE
Protein, ur: NEGATIVE mg/dL
Specific Gravity, Urine: 1.016 (ref 1.005–1.030)
pH: 6 (ref 5.0–8.0)

## 2023-07-23 LAB — COMPREHENSIVE METABOLIC PANEL WITH GFR
ALT: 14 U/L (ref 0–44)
AST: 17 U/L (ref 15–41)
Albumin: 3.9 g/dL (ref 3.5–5.0)
Alkaline Phosphatase: 66 U/L (ref 38–126)
Anion gap: 9 (ref 5–15)
BUN: 11 mg/dL (ref 6–20)
CO2: 24 mmol/L (ref 22–32)
Calcium: 9.3 mg/dL (ref 8.9–10.3)
Chloride: 107 mmol/L (ref 98–111)
Creatinine, Ser: 0.91 mg/dL (ref 0.44–1.00)
GFR, Estimated: >60 mL/min (ref >60)
Glucose, Bld: 169 mg/dL — ABNORMAL HIGH (ref 70–99)
Potassium: 3 mmol/L — ABNORMAL LOW (ref 3.5–5.1)
Sodium: 140 mmol/L (ref 135–145)
Total Bilirubin: 0.5 mg/dL (ref 0.0–1.2)
Total Protein: 7.4 g/dL (ref 6.5–8.1)

## 2023-07-23 MED ORDER — MECLIZINE HCL 25 MG PO TABS
25.0000 mg | ORAL_TABLET | Freq: Once | ORAL | Status: AC
  Administered 2023-07-23: 25 mg via ORAL
  Filled 2023-07-23: qty 1

## 2023-07-23 MED ORDER — POTASSIUM CHLORIDE CRYS ER 20 MEQ PO TBCR
40.0000 meq | EXTENDED_RELEASE_TABLET | Freq: Once | ORAL | Status: AC
  Administered 2023-07-23: 40 meq via ORAL
  Filled 2023-07-23: qty 2

## 2023-07-23 NOTE — ED Triage Notes (Addendum)
 Pt comes with c/o dizziness that started on Friday. Pt having blurry vision on same day. Pt has HTN but not taking any meds currently. Pt speaking in clear sentences. Pt states blurry vision is still present.

## 2023-07-23 NOTE — ED Provider Notes (Signed)
 [facility] Provider Note    Event Date/Time   First MD Initiated Contact with Patient 07/23/23 2122     (approximate)   History   Dizziness   HPI  KHADIDJA RUDIE is a 58 year old female with history of CVA, T2DM, HTN presenting to the ER for evaluation of dizziness and vision changes.  On Friday patient reports onset of dizziness described as a spinning sensation.  Reports that she had double vision throughout the day on Friday into Saturday.  States that her double vision is now resolved, but continues to have blurred vision in her bilateral eyes.  Not painful.  Reports mild ongoing left-sided weakness from prior CVA, no new numbness, tingling, or weakness.    Physical Exam   Triage Vital Signs: ED Triage Vitals  Encounter Vitals Group     BP 07/23/23 1846 (!) 152/127     Systolic BP Percentile --      Diastolic BP Percentile --      Pulse Rate 07/23/23 1846 79     Resp 07/23/23 1846 18     Temp 07/23/23 1846 98 F (36.7 C)     Temp Source 07/23/23 1846 Oral     SpO2 07/23/23 1846 99 %     Weight 07/23/23 1849 138 lb (62.6 kg)     Height 07/23/23 1849 5\' 4"  (1.626 m)     Head Circumference --      Peak Flow --      Pain Score 07/23/23 1849 5     Pain Loc --      Pain Education --      Exclude from Growth Chart --     Most recent vital signs: Vitals:   07/23/23 1846 07/23/23 2247  BP: (!) 152/127 (!) 149/115  Pulse: 79 72  Resp: 18 18  Temp: 98 F (36.7 C) 97.7 F (36.5 C)  SpO2: 99% 99%     General: Awake, interactive  Eyes:  Vision 20/30 in left eye, right eye, and bilateral eyes CV:  Regular rate, good peripheral perfusion.  Resp:  Unlabored respirations.  Abd:  Nondistended.  Neuro:  Keenly aware, correctly answers month and age, able to blink eyes and squeeze hands, normal horizontal extraocular movements, no visual field loss, normal facial symmetry, no arm or leg motor drift, no limb ataxia, normal sensation, no aphasia,  no dysarthria, no inattention. NIH 0    ED Results / Procedures / Treatments   Labs (all labs ordered are listed, but only abnormal results are displayed) Labs Reviewed  COMPREHENSIVE METABOLIC PANEL WITH GFR - Abnormal; Notable for the following components:      Result Value   Potassium 3.0 (*)    Glucose, Bld 169 (*)    All other components within normal limits  URINALYSIS, ROUTINE W REFLEX MICROSCOPIC - Abnormal; Notable for the following components:   Color, Urine YELLOW (*)    APPearance HAZY (*)    All other components within normal limits  CBC     EKG EKG independently reviewed interpreted by myself (ER attending) demonstrates:  EKG demonstrates normal sinus rhythm rate of 83, PR 148, QRS 70, QTc 477, nonspecific ST changes  RADIOLOGY Imaging independently reviewed and interpreted by myself demonstrates:  CT head without acute bleed  Formal Radiology Read:  CT HEAD WO CONTRAST ( ) Result Date: 07/23/2023 CLINICAL DATA:  Dizziness for several days, initial encounter EXAM: CT HEAD WITHOUT CONTRAST TECHNIQUE: Contiguous axial images were obtained from the base  of the skull through the vertex without intravenous contrast. RADIATION DOSE REDUCTION: This exam was performed according to the departmental dose-optimization program which includes automated exposure control, adjustment of the mA and/or kV according to patient size and/or use of iterative reconstruction technique. COMPARISON:  08/11/2022 FINDINGS: Brain: No evidence of acute infarction, hemorrhage, hydrocephalus, extra-axial collection or mass lesion/mass effect. Areas of decreased attenuation are noted in the basal ganglia bilaterally as well as in the right thalamus consistent infarcts. Vascular: No hyperdense vessel or unexpected calcification. Skull: Normal. Negative for fracture or focal lesion. Sinuses/Orbits: No acute finding. Other: None. IMPRESSION: Areas of prior remote ischemia.  No acute abnormality noted.  Electronically Signed   By: Violeta Grey M.D.   On: 07/23/2023 19:43    PROCEDURES:  Critical Care performed: No  Procedures   MEDICATIONS ORDERED IN ED: Medications  potassium chloride  SA (KLOR-CON  M) CR tablet 40 mEq (has no administration in time range)  meclizine (ANTIVERT) tablet 25 mg (25 mg Oral Given 07/23/23 2244)     IMPRESSION / MDM / ASSESSMENT AND PLAN / ED COURSE  I reviewed the triage vital signs and the nursing notes.  Differential diagnosis includes, but is not limited to, CVA, TIA, peripheral vertigo, anemia, electrolyte abnormality  Patient's presentation is most consistent with acute presentation with potential threat to life or bodily function.  58 year old female presenting to the emergency department for evaluation of dizziness and vision changes.  Outside window for intervention.  Reports resolved diplopia.  Labs in triage without critical derangement, mild hypokalemia for which oral repletion was ordered.  CT head without acute findings.  Discussed MRI in the setting of her recent diplopia, patient initially hesitant.  Did trial meclizine without any improvement.  Patient now agreeable for MRI.  Signed at oncoming physician at 2300 pending completion of MRI, reevaluation and disposition.      FINAL CLINICAL IMPRESSION(S) / ED DIAGNOSES   Final diagnoses:  Vertigo  Blurred vision     Rx / DC Orders   ED Discharge Orders     None        Note:  This document was prepared using Dragon voice recognition software and may include unintentional dictation errors.   Claria Crofts, MD 07/23/23 [phone]

## 2023-07-23 NOTE — ED Notes (Signed)
 Patient in MRI

## 2023-07-24 ENCOUNTER — Observation Stay

## 2023-07-24 DIAGNOSIS — I6381 Other cerebral infarction due to occlusion or stenosis of small artery: Secondary | ICD-10-CM | POA: Diagnosis not present

## 2023-07-24 DIAGNOSIS — I69359 Hemiplegia and hemiparesis following cerebral infarction affecting unspecified side: Secondary | ICD-10-CM

## 2023-07-24 DIAGNOSIS — I999 Unspecified disorder of circulatory system: Secondary | ICD-10-CM

## 2023-07-24 DIAGNOSIS — I700 Atherosclerosis of aorta: Secondary | ICD-10-CM | POA: Diagnosis not present

## 2023-07-24 DIAGNOSIS — I639 Cerebral infarction, unspecified: Secondary | ICD-10-CM | POA: Diagnosis not present

## 2023-07-24 DIAGNOSIS — I10 Essential (primary) hypertension: Secondary | ICD-10-CM | POA: Diagnosis not present

## 2023-07-24 DIAGNOSIS — I6529 Occlusion and stenosis of unspecified carotid artery: Secondary | ICD-10-CM | POA: Diagnosis not present

## 2023-07-24 LAB — LIPID PANEL
Cholesterol: 217 mg/dL — ABNORMAL HIGH (ref 0–200)
HDL: 68 mg/dL (ref >40)
LDL Cholesterol: 133 mg/dL — ABNORMAL HIGH (ref 0–99)
Total CHOL/HDL Ratio: 3.2 ratio
Triglycerides: 79 mg/dL (ref <150)
VLDL: 16 mg/dL (ref 0–40)

## 2023-07-24 LAB — BASIC METABOLIC PANEL WITH GFR
Anion gap: 7 (ref 5–15)
BUN: 13 mg/dL (ref 6–20)
CO2: 26 mmol/L (ref 22–32)
Calcium: 9.2 mg/dL (ref 8.9–10.3)
Chloride: 108 mmol/L (ref 98–111)
Creatinine, Ser: 0.88 mg/dL (ref 0.44–1.00)
GFR, Estimated: >60 mL/min (ref >60)
Glucose, Bld: 77 mg/dL (ref 70–99)
Potassium: 3.8 mmol/L (ref 3.5–5.1)
Sodium: 141 mmol/L (ref 135–145)

## 2023-07-24 LAB — HIV ANTIBODY (ROUTINE TESTING W REFLEX): HIV Screen 4th Generation wRfx: NONREACTIVE

## 2023-07-24 MED ORDER — CLOPIDOGREL BISULFATE 75 MG PO TABS
75.0000 mg | ORAL_TABLET | Freq: Every day | ORAL | Status: DC
  Administered 2023-07-24 – 2023-07-25 (×2): 75 mg via ORAL
  Filled 2023-07-24 (×2): qty 1

## 2023-07-24 MED ORDER — LOSARTAN POTASSIUM 25 MG PO TABS
25.0000 mg | ORAL_TABLET | Freq: Every day | ORAL | Status: DC
  Administered 2023-07-24 – 2023-07-25 (×2): 25 mg via ORAL
  Filled 2023-07-24 (×2): qty 1

## 2023-07-24 MED ORDER — ASPIRIN 300 MG RE SUPP: 300.0000 mg | Freq: Every day | RECTAL | Status: DC

## 2023-07-24 MED ORDER — ACETAMINOPHEN 325 MG PO TABS: 650.0000 mg | ORAL_TABLET | ORAL | Status: DC | PRN

## 2023-07-24 MED ORDER — ACETAMINOPHEN 650 MG RE SUPP: 650.0000 mg | RECTAL | Status: DC | PRN

## 2023-07-24 MED ORDER — ASPIRIN 325 MG PO TABS
325.0000 mg | ORAL_TABLET | Freq: Once | ORAL | Status: AC
  Administered 2023-07-24: 325 mg via ORAL
  Filled 2023-07-24: qty 1

## 2023-07-24 MED ORDER — ATORVASTATIN CALCIUM 20 MG PO TABS
40.0000 mg | ORAL_TABLET | Freq: Every day | ORAL | Status: DC
  Administered 2023-07-24: 40 mg via ORAL
  Filled 2023-07-24: qty 2

## 2023-07-24 MED ORDER — ASPIRIN 300 MG RE SUPP
300.0000 mg | Freq: Once | RECTAL | Status: AC
Start: 2023-07-24 — End: 2023-07-24

## 2023-07-24 MED ORDER — AMLODIPINE BESYLATE 10 MG PO TABS
10.0000 mg | ORAL_TABLET | Freq: Every day | ORAL | Status: DC
  Administered 2023-07-24 – 2023-07-25 (×2): 10 mg via ORAL
  Filled 2023-07-24 (×2): qty 1

## 2023-07-24 MED ORDER — ASPIRIN 325 MG PO TABS: 325.0000 mg | ORAL_TABLET | Freq: Every day | ORAL | Status: DC

## 2023-07-24 MED ORDER — STROKE: EARLY STAGES OF RECOVERY BOOK: Freq: Once | Status: AC

## 2023-07-24 MED ORDER — IOHEXOL 350 MG/ML SOLN
75.0000 mL | Freq: Once | INTRAVENOUS | Status: AC | PRN
  Administered 2023-07-24: 75 mL via INTRAVENOUS

## 2023-07-24 MED ORDER — ACETAMINOPHEN 160 MG/5ML PO SOLN: 650.0000 mg | ORAL | Status: DC | PRN

## 2023-07-24 MED ORDER — ASPIRIN 81 MG PO TBEC
81.0000 mg | DELAYED_RELEASE_TABLET | Freq: Every day | ORAL | Status: DC
  Administered 2023-07-25: 81 mg via ORAL
  Filled 2023-07-24: qty 1

## 2023-07-24 MED ORDER — ENOXAPARIN SODIUM 40 MG/0.4ML IJ SOSY
40.0000 mg | PREFILLED_SYRINGE | INTRAMUSCULAR | Status: DC
  Administered 2023-07-24 – 2023-07-25 (×2): 40 mg via SUBCUTANEOUS
  Filled 2023-07-24 (×2): qty 0.4

## 2023-07-24 NOTE — Consult Note (Signed)
 NEUROLOGY CONSULT NOTE   Date of service: Jul 24, 2023 Patient Name: Rebecca Yang MRN:  [id number] DOB:  November 09, 1965 Chief Complaint: "Dizziness" Requesting Provider: Joette Mustard, MD  History of Present Illness  Rebecca Yang is a 58 y.o. 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 She described the dizziness as a spinning sensation. She started having double vision on Friday that lasted into Saturday but is now resolved. The visual blurring involves both eyes. She denied any new weakness or numbness. No eye pain or headache.   MRI revealed a small acute infarct within the right periaqueductal gray matter in the region of the oculomotor nerve nucleus. Also noted were multiple chronic microhemorrhages appearing most consistent with a hypertensive etiology.   Home medications include atorvastatin . She is not taking ASA or Plavix and is not on a blood thinner at home.    ROS  Comprehensive ROS performed and pertinent positives documented in HPI   Past History   Past Medical History:  Diagnosis Date   Hyperlipidemia    Hypertension    Neuropathy    Sarcoidosis    Stroke [facility])     Past Surgical History:  Procedure Laterality Date   GALLBLADDER SURGERY     HAND SURGERY     SKIN SURGERY      Family History: Family History  Problem Relation Age of Onset   Gout Brother    Asthma Son    Gout Maternal Grandmother     Social History  reports that she has been smoking cigarettes. She has never used smokeless tobacco. She reports that she does not currently use alcohol. She reports that she does not currently use drugs.  Allergies  Allergen Reactions   Penicillins Hives    Medications   Current Facility-Administered Medications:    [START  ON 07/25/2023]  stroke: early stages of recovery book, , Does not apply, Once, Lanetta Pion, MD   acetaminophen  (TYLENOL ) tablet 650 mg, 650 mg, Oral, Q4H PRN **OR** acetaminophen  (TYLENOL ) 160 MG/5ML solution 650 mg, 650 mg, Per Tube, Q4H PRN **OR** acetaminophen  (TYLENOL ) suppository 650 mg, 650 mg, Rectal, Q4H PRN, Lanetta Pion, MD   amLODipine  (NORVASC ) tablet 10 mg, 10 mg, Oral, Daily, Brion Cancel V, MD, 10 mg at 07/24/23 1044   [START ON 07/25/2023] aspirin EC tablet 81 mg, 81 mg, Oral, Daily, Lanetta Pion, MD   atorvastatin  (LIPITOR) tablet 40 mg, 40 mg, Oral, Daily, Brion Cancel V, MD, 40 mg at 07/24/23 1044   clopidogrel (PLAVIX) tablet 75 mg, 75 mg, Oral, Daily, Brion Cancel V, MD, 75 mg at 07/24/23 1044   enoxaparin (LOVENOX) injection 40 mg, 40 mg, Subcutaneous, Q24H, Brion Cancel V, MD, 40 mg at 07/24/23 1044   losartan  (COZAAR ) tablet 25 mg, 25 mg, Oral, Daily, Brion Cancel V, MD, 25 mg at 07/24/23 1044  Vitals   Vitals:   07/24/23 0340 07/24/23 0829 07/24/23 1144 07/24/23 1230  BP: (!) 159/93 (!) 144/98 (!) 142/94 136/88  Pulse: 62 61 71 82  Resp: 19 16 16 18   Temp: 97.9 F (36.6 C) 98 F (36.7 C) 98.2 F (36.8 C) 98.6 F (37 C)  TempSrc: Oral   Oral  SpO2: 97% 100% 97% 98%  Weight: 66.4 kg  Height: 5\' 4"  (1.626 m)       Body mass index is 25.13 kg/m.  Physical Exam   Physical Exam HEENT- [place]/AT   Lungs- Respirations unlabored Extremities- Warm and well-perfused  Neurological Examination Mental Status: Awake and alert. Oriented x 5. Thought content appropriate. Speech fluent with intact naming and comprehension. Able to follow all commands without difficulty. Cranial Nerves: II: Visual fields intact in all 4 quadrants OU. No extinction to DSS. PERRL. III,IV, VI: No ptosis. EOMI with mild saccadic quality of pursuits. No nystagmus. V: Temp sensation decreased on the RIGHT VII: Mild left facial droop VIII: Hearing intact to voice IX,X: No  hypophonia or hoarseness XI: Symmetric XII: Midline tongue extension Motor: RUE: 5/5 LUE: 4/5 proximally and distally RLE: 5/5 LLE: 5/5 No pronator drift Sensory: Decreased temp sensation to RUE. Temp sensation to BLE is equal. FT intact x 4. No extinction to DSS. Deep Tendon Reflexes: 2+ and symmetric bilateral biceps and brachioradialis. 2+ left patellar, 0 right patellar. Cerebellar: No ataxia with FNF bilaterally, but slower on the left.  Gait: Deferred   Labs/Imaging/Neurodiagnostic studies   CBC:  Recent Labs  Lab 08-22-23 1851  WBC 5.8  HGB 12.3  HCT 36.3  MCV 91.9  PLT 186   Basic Metabolic Panel:  Lab Results  Component Value Date   NA 141 07/24/2023   K 3.8 07/24/2023   CO2 26 07/24/2023   GLUCOSE 77 07/24/2023   BUN 13 07/24/2023   CREATININE 0.88 07/24/2023   CALCIUM  9.2 07/24/2023   GFRNONAA >60 07/24/2023   GFRAA >60 05/03/2013   Lipid Panel:  Lab Results  Component Value Date   LDLCALC 133 (H) 07/24/2023   HgbA1c:  Lab Results  Component Value Date   HGBA1C 6.0 (H) 04/18/2023    CT Head without contrast (Personally reviewed): Areas of decreased attenuation are noted in the basal ganglia bilaterally, worse on the right, as well as in the right thalamus, consistent with infarcts   MRI Brain (Personally reviewed): 1. Small acute infarct within the right periaqueductal gray matter, in the region of the oculomotor nerve nucleus. 2. Multiple chronic microhemorrhages in a predominantly central distribution, consistent with chronic hypertensive angiopathy.     ASSESSMENT  Rebecca Yang is a 58 y.o. female presenting with dizziness and double vision.  - Exam reveals chronic left UMN deficits and decreased sensation on the RIGHT. EOMI. Double vision has resolved.  - Imaging as above.  - Stroke risk factors: HLD, HTN, DM, smoking and prior stroke - Impression: Acute right paramedian midbrain ischemic infarction, most likely secondary to  chronic hypertensive angiopathy - Multiple chronic microhemorrhages, most likely secondary to chronic uncontrolled HTN  RECOMMENDATIONS  - BP management per standard protocol. Out of the permissive HTN time window.  - She will need to purchase a BP cuff (if she does not already have one) and start taking BP readings daily, to be recorded in a BP diary.  - Maintain compliance with antihypertensive medications.  - Continue atorvastatin .  - Agree with starting ASA 81mg  daily, together with Plavix 75mg  daily x 3 weeks, then monotherapy with ASA thereafter - TTE - Cardiac telemetry - CTA of head and neck - Glycemic control - Neuro checks q4 hrs until 7 PM this evening, then per shift - PT/OT/Speech - HgbA1c, fasting lipid panel - Smoking cessation:   ______________________________________________________________________    Hope Ly, Sheritta Deeg, MD [facility]

## 2023-07-24 NOTE — ED Notes (Signed)
 Patient assisted to use the restroom. Patient able to stand and pivot to sit in wheelchair. Patient wheeled to restroom. Patient urinated and returned to bed. Patient denies other needs at this time.

## 2023-07-24 NOTE — Hospital Course (Signed)
 Rebecca Yang

## 2023-07-24 NOTE — Evaluation (Signed)
 Physical Therapy Evaluation Patient Details Name: Rebecca Yang MRN: [id number] DOB: 1965-07-08 Today's Date: 07/24/2023  History of Present Illness  Rebecca Yang is a 58 y.o. 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  She started experiencing dizziness, vertigo and double vision. MRI showed Small acute infarct within the right periaqueductal gray matter, in the region of the oculomotor nerve nucleus.   Clinical Impression  Patient admitted with above diagnosis. Patient supine in bed upon arrival, awakens to therapist voice and agreeable to PT evaluation. Per patient reports, prior to admission patient was IND with mobility/ADLs without use of AD. Patient lives in second level apartment, with 11 STE with rail on L side. Patient denies fall history. On evaluation, patient able to complete bed mobility MOD I. Patient able to stand from EOB with CGA, and ambulate approx 40 ft with CGA progressing to supervision with RW. Mild imbalance without use of AD this date, encouraged use of RW for safety. Patient left in recliner with all needs in reach, and breakfast tray set up. Patient will benefit from skilled acute PT services to address functional impairments (see below for additional) and maximize functional mobility. Anticipate the need for follow up PT services upon acute hospital discharge. Will continue to follow acutely.       If plan is discharge home, recommend the following: A little help with walking and/or transfers;Help with stairs or ramp for entrance;Assist for transportation   Can travel by private vehicle        Equipment Recommendations Rolling walker (2 wheels)  Recommendations for Other Services       Functional Status Assessment Patient has had a recent decline in their functional status  and demonstrates the ability to make significant improvements in function in a reasonable and predictable amount of time.     Precautions / Restrictions Precautions Precautions: Fall Restrictions Weight Bearing Restrictions Per Provider Order: No      Mobility  Bed Mobility Overal bed mobility: Modified Independent             General bed mobility comments: increased time for transition from supine > sit; no physical assist required    Transfers Overall transfer level: Needs assistance Equipment used: Rolling walker (2 wheels) Transfers: Sit to/from Stand Sit to Stand: Contact guard assist           General transfer comment: STS from EOB with use of RW, CGA    Ambulation/Gait Ambulation/Gait assistance: Contact guard assist, Supervision Gait Distance (Feet): 40 Feet Assistive device: Rolling walker (2 wheels)         General Gait Details: Pt able to ambulate x 40 ft with use of RW, CGA initial progressing to supervision. Mild imbalance. Pt slightly impulsive at start of gait due to urinary urgency. Improvements after use of bathroom.  Stairs            Wheelchair Mobility     Tilt Bed    Modified Rankin (Stroke Patients Only)       Balance Overall balance assessment: Needs assistance Sitting-balance support: Feet supported, No upper extremity supported Sitting balance-Leahy Scale: Normal     Standing balance support: Bilateral upper extremity supported, During functional activity Standing balance-Leahy Scale: Good Standing balance comment: use of RW this date for improved stability; mild imbalance without  Pertinent Vitals/Pain Pain Assessment Pain Assessment: No/denies pain    Home Living Family/patient expects to be discharged to:: Private residence Living Arrangements: Other (Comment) ("Son's Father") Available Help at Discharge: Family;Available 24 hours/day Type of Home: Apartment Home  Access: Stairs to enter Entrance Stairs-Rails: Left Entrance Stairs-Number of Steps: 11   Home Layout: One level Home Equipment: None      Prior Function Prior Level of Function : Independent/Modified Independent             Mobility Comments: Per patient reports; IND without AD. No Falls. ADLs Comments: IND with ADLs per patient report     Extremity/Trunk Assessment   Upper Extremity Assessment Upper Extremity Assessment: Defer to OT evaluation    Lower Extremity Assessment Lower Extremity Assessment: Overall WFL for tasks assessed    Cervical / Trunk Assessment Cervical / Trunk Assessment: Normal  Communication   Communication Communication: No apparent difficulties    Cognition Arousal: Alert Behavior During Therapy: WFL for tasks assessed/performed   PT - Cognitive impairments: No family/caregiver present to determine baseline                         Following commands: Intact       Cueing Cueing Techniques: Verbal cues     General Comments      Exercises Other Exercises Other Exercises: Pt able to donn socks IND, able to complete pericare with inc time.   Assessment/Plan    PT Assessment Patient needs continued PT services  PT Problem List Decreased strength;Decreased activity tolerance;Decreased balance;Decreased mobility       PT Treatment Interventions DME instruction;Gait training;Stair training;Functional mobility training;Therapeutic activities;Therapeutic exercise;Balance training;Neuromuscular re-education    PT Goals (Current goals can be found in the Care Plan section)  Acute Rehab PT Goals Patient Stated Goal: Get Back Home PT Goal Formulation: With patient Time For Goal Achievement: 08/07/23 Potential to Achieve Goals: Good    Frequency Min 2X/week     Co-evaluation               AM-PAC PT "6 Clicks" Mobility  Outcome Measure Help needed turning from your back to your side while in a flat bed without using  bedrails?: None Help needed moving from lying on your back to sitting on the side of a flat bed without using bedrails?: None Help needed moving to and from a bed to a chair (including a wheelchair)?: A Little Help needed standing up from a chair using your arms (e.g., wheelchair or bedside chair)?: A Little Help needed to walk in hospital room?: A Little Help needed climbing 3-5 steps with a railing? : A Lot 6 Click Score: 19    End of Session Equipment Utilized During Treatment: Gait belt Activity Tolerance: Patient tolerated treatment well Patient left: in chair;with call bell/phone within reach;with chair alarm set Nurse Communication: Mobility status PT Visit Diagnosis: Unsteadiness on feet (R26.81);Other abnormalities of gait and mobility (R26.89);Muscle weakness (generalized) (M62.81)    Time: 1610-9604 PT Time Calculation (min) (ACUTE ONLY): 24 min   Charges:   PT Evaluation $PT Eval Low Complexity: 1 Low PT Treatments $Therapeutic Activity: 8-22 mins PT General Charges $$ ACUTE PT VISIT: 1 Visit         Donnie Galea, PT, DPT 07/24/23 12:28 PM

## 2023-07-24 NOTE — Assessment & Plan Note (Addendum)
 History of hemorrhagic CVA right thalamus 2022 with residual deficit left upper extremity Continue home antihypertensives and adjust to goal BP as patient is greater than 24-48 hrs post stroke onset:  Statins for LDL goal less than 70 ASA 81mg  daily, Plavix 75mg  daily x 3 weeks then monotherapy thereafter Telemetry, echo Avoid dextrose containing fluids, Maintain euglycemia, euthermia Neuro checks q4 hrs x 24 hrs and then per shift Head of bed 30 degrees Physical therapy/Occupational therapy/Speech therapy if failed dysphagia screen Neurology consult to follow

## 2023-07-24 NOTE — Assessment & Plan Note (Signed)
 Albuterol prn

## 2023-07-24 NOTE — Progress Notes (Signed)
 SLP Cancellation Note  Patient Details Name: KIMIKA BOURBON MRN: [id number] DOB: Jul 20, 1965   Cancelled treatment:       Reason Eval/Treat Not Completed: SLP screened, no needs identified, will sign off   Wynne Rozak 07/24/2023, 8:17 AM

## 2023-07-24 NOTE — H&P (Signed)
 History and Physical    Patient: Rebecca Yang ZOX:[id number] DOB: 1965/06/13 DOA: 07/23/2023 DOS: the patient was seen and examined on 07/24/2023 PCP: Blane Bunting, PA-C  Patient coming from: Home  Chief Complaint:  Chief Complaint  Patient presents with   Dizziness   HPI: Rebecca Yang is a 58 y.o. 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  She started experiencing dizziness, vertigo and double with visionabout 4 days ago on 5/9.  She denies worsening of her prior left upper extremity deficit or other one-sided numbness weakness or tingling.  States she sees better when she closes her right eye.  Denies headache ED course and data review: BP 149/115 with otherwise normal vitals Labs notable for potassium of 3 but otherwise CBC, CMP and UA all within normal limits  EKG Personally viewed and interpreted showing NSR at 83 with nonspecific ST-T wave abnormalities MRI brain showed a small acute infarct as follows: IMPRESSION: 1. Small acute infarct within the right periaqueductal gray matter, in the region of the oculomotor nerve nucleus. 2. Multiple chronic microhemorrhages in a predominantly central distribution, consistent with chronic hypertensive angiopathy.  Patient treated with meclizine and given oral potassium  Hospitalist consulted for admission.  Review of Systems: As mentioned in the history of present illness. All other systems reviewed and are negative. Past Medical History:  Diagnosis Date   Hyperlipidemia    Hypertension    Neuropathy    Sarcoidosis    Stroke Rebecca [facility])    Past Surgical History:  Procedure Laterality Date   GALLBLADDER SURGERY     HAND SURGERY     SKIN SURGERY     Social History:  reports that she has been smoking cigarettes. She has never used smokeless  tobacco. She reports that she does not currently use alcohol. She reports that she does not currently use drugs.  Allergies  Allergen Reactions   Penicillins Hives    Family History  Problem Relation Age of Onset   Gout Brother    Asthma Son    Gout Maternal Grandmother     Prior to Admission medications   Medication Sig Start Date End Date Taking? Authorizing Provider  acetaminophen  (TYLENOL ) 325 MG tablet Take 650 mg by mouth every 6 (six) hours as needed.    [provider]  amLODipine  (NORVASC ) 10 MG tablet Take 1 tablet by mouth once daily Patient not taking: Reported on 07/24/2023 04/30/23   Ostwalt, Janna, PA-C  atorvastatin  (LIPITOR) 40 MG tablet Take 1 tablet (40 mg total) by mouth daily. Patient not taking: Reported on 07/24/2023 02/28/22   Ostwalt, Janna, PA-C  losartan  (COZAAR ) 25 MG tablet Take 1 tablet (25 mg total) by mouth daily. Patient not taking: Reported on 07/24/2023 07/12/20   Montey Apa, DO  metFORMIN  (GLUCOPHAGE ) 500 MG tablet Take 1 tablet (500 mg total) by mouth daily with breakfast. Patient not taking: Reported on 07/24/2023 07/17/22   Ostwalt, Janna, PA-C  nicotine  polacrilex (NICORETTE ) 2 MG gum Take 1 each (2 mg total) by mouth as needed for smoking cessation. 04/18/23   Ostwalt, Janna, PA-C    Physical Exam: Vitals:   07/23/23 1846 07/23/23 1849 07/23/23 2247 07/24/23 0340  BP: (!) 152/127  (!) 149/115 (!) 159/93  Pulse: 79  72 62  Resp: 18  18 19   Temp: 98 F (36.7 C)  97.7  F (36.5 C) 97.9 F (36.6 C)  TempSrc: Oral  Axillary Oral  SpO2: 99%  99% 97%  Weight:  62.6 kg  66.4 kg  Height:  5\' 4"  (1.626 m)  5\' 4"  (1.626 m)   Physical Exam Vitals and nursing note reviewed.  Constitutional:      General: She is not in acute distress. HENT:     Head: Normocephalic and atraumatic.  Cardiovascular:     Rate and Rhythm: Normal rate and regular rhythm.     Heart sounds: Normal heart sounds.  Pulmonary:     Effort: Pulmonary effort is  normal.     Breath sounds: Normal breath sounds.  Abdominal:     Palpations: Abdomen is soft.     Tenderness: There is no abdominal tenderness.  Neurological:     Mental Status: She is oriented to person, place, and time.     Data Reviewed: Relevant notes from primary care and specialist visits, past discharge summaries as available in EHR, including Care Everywhere. 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 Renaee Caro  Family Communication:   Severity of Illness: The appropriate patient status for this patient is OBSERVATION. Observation status is judged to be reasonable and necessary in order to provide the required intensity of service to ensure the patient's safety. The patient's presenting symptoms, physical exam findings, and initial radiographic and laboratory data in the context of their medical condition is felt to place them at decreased risk for further clinical  deterioration. Furthermore, it is anticipated that the patient will be medically stable for discharge from the [facility] within 2 midnights of admission.   Author: Lanetta Pion, MD 07/24/2023 4:28 AM  For on call review [email or web address].

## 2023-07-24 NOTE — Evaluation (Addendum)
 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 PTA pt reports she is MOD I-I in ADL, has Prn assist for IADL (does not drive) but does med management with MOD I. Pt reports she amb with no AD. Pt is alert and oriented x4, ?awareness/insight into current deficits. Will continue to assess. Pt presents with deficits in activity tolerance, balance, endurance, cognition, and vision affecting safe and optimal ADL completion. Pt reports she has difficulties focusing, improved with one eye occluded. Pt is able to read objects throughout the room, poor performance on attempt at clock drawing with numbers outside of provided circle and minute/hour hands not placed appropriately. Bed mobility completed with MOD I, STS with Cga, amb to bathroom with no AD with CGA-MIN A. SET UP for grooming tasks. Pt is left as received, all needs met. OT will follow acutely to facilitate optimal ADL performance.  Pt completed SLUMS examination this date scoring 20/30 indicating  Of note, it is not within occupational therapy scope of practice to diagnose cognitive impairments, this screen indicates need for further testing. The SLUMS is a 30 point, 11 question screening questionnaire that tests orientation, memory, attention, and executive function. Pt with noted impairments in short term  memory, problem solving, and executive function limiting ability to    [facility] Mental Status Examination Orientation: 3/3  Calculations: 3/3 Naming animals: 1/3 Patient named 9 animals (0 points is 0-4 animals; 1 is 5-9 animals; 2 is 10-14 animals; 3 is 15+ animals) Recall: 4/5  Attention: 1/2 Clock drawing: 0/4 Visual Processing: 2/2 Paragraph Memory: 6/8  Total: 20/30;  Given that patient has High schol level of education, this score falls in the dementia range.      If plan is discharge home, recommend the following:   A little help with walking and/or transfers;A little help with bathing/dressing/bathroom;Direct supervision/assist for medications management;Direct supervision/assist for financial management     Functional Status Assessment   Patient has had a recent decline in their functional status and demonstrates the ability to make significant improvements in function in a reasonable and predictable amount of time.     Equipment Recommendations   BSC/3in1;Tub/shower seat     Recommendations for Other Services         Precautions/Restrictions   Precautions Precautions: Fall Recall of Precautions/Restrictions: Impaired Restrictions Weight Bearing Restrictions Per Provider Order: No     Mobility Bed Mobility Overal bed mobility: Modified Independent                  Transfers Overall transfer level: Needs assistance Equipment used: None Transfers: Sit to/from Stand Sit to Stand: Contact guard assist                  Balance Overall balance assessment: Needs assistance Sitting-balance support: Feet supported, No upper extremity supported Sitting balance-Leahy Scale: Good     Standing balance support: No upper extremity supported Standing balance-Leahy Scale:  Fair                             ADL either performed or assessed with clinical judgement   ADL Overall ADL's : Needs assistance/impaired      Grooming: Supervision/safety;Sitting               Lower Body Dressing: Supervision/safety;Sitting/lateral leans   Toilet Transfer: Contact guard assist;Minimal assistance;Regular Toilet;Cueing for sequencing Toilet Transfer Details (indicate cue type and reason): no AD, anticipate CGA with RW         Functional mobility during ADLs: Contact guard assist;Minimal assistance;Cueing for safety;Cueing for sequencing (amb approx 15' with no AD)       Vision Patient Visual Report: Eye fatigue/eye pain/headache;Blurring of vision (difficulties focusing) Vision Assessment?: Yes Eye Alignment: Within Functional Limits Ocular Range of Motion: Within Functional Limits Alignment/Gaze Preference: Gaze left Tracking/Visual Pursuits: Able to track stimulus in all quads without difficulty Saccades: Within functional limits Convergence: Within functional limits Visual Fields: No apparent deficits Additional Comments: decreased dizziness when one eye occluded     Perception Perception: Within Functional Limits (will continue to assess)       Praxis Praxis: WFL       Pertinent Vitals/Pain Pain Assessment Pain Assessment: No/denies pain     Extremity/Trunk Assessment Upper Extremity Assessment Upper Extremity Assessment: Generalized weakness;LUE deficits/detail;Right hand dominant LUE Deficits / Details: ?flexor synergy when amb to bathroom; MAS 0 throughout all joints of LUE   Lower Extremity Assessment Lower Extremity Assessment: Overall WFL for tasks assessed   Cervical / Trunk Assessment Cervical / Trunk Assessment: Normal   Communication Communication Communication: No apparent difficulties   Cognition Arousal: Alert Behavior During Therapy: Flat affect Cognition: No family/caregiver present to determine baseline, Cognition impaired     Awareness: Online awareness impaired Memory impairment (select all impairments): Short-term memory Attention impairment (select  first level of impairment): Selective attention Executive functioning impairment (select all impairments): Reasoning, Problem solving OT - Cognition Comments: pt participates in SLUMS- scores 20/30                 Following commands: Impaired Following commands impaired: Follows one step commands with increased time     Cueing  General Comments   Cueing Techniques: Verbal cues;Tactile cues  vss throughout   Exercises Other Exercises Other Exercises: edu re: role of OT, role of rehab, discharge recommendations, safe ADL completion   Shoulder Instructions      Home Living Family/patient expects to be discharged to:: Private residence Living Arrangements: Other (Comment) (son's father) Available Help at Discharge: Family;Available 24 hours/day Type of Home: Apartment Home Access: Stairs to enter Entrance Stairs-Number of Steps: 11 Entrance Stairs-Rails: Left Home Layout: One level     Bathroom Shower/Tub: Runner, broadcasting/film/video: None          Prior Functioning/Environment Prior Level of Function : Independent/Modified Independent             Mobility Comments: pt reports amb with no AD ADLs Comments: pt reports MOD I-I in ADL, does not drive, reports she manages meds but cannot afford them    OT Problem List: Decreased activity tolerance;Decreased knowledge of use of DME or AE;Impaired balance (sitting and/or standing)   OT Treatment/Interventions: Self-care/ADL training;DME and/or AE instruction;Therapeutic activities;Balance training;Therapeutic exercise;Patient/family education      OT Goals(Current goals can be found in the care plan section)  Acute Rehab OT Goals Patient Stated Goal: go home OT Goal Formulation: With patient Time For Goal Achievement: 08/07/23 Potential to Achieve Goals: Good ADL Goals Pt Will Perform Grooming: with modified independence;sitting Pt Will Perform Lower Body Dressing: with modified  independence;sitting/lateral leans;sit to/from stand Pt Will Transfer to Toilet: with modified independence;ambulating Pt Will Perform Toileting - Clothing Manipulation and hygiene: with modified independence;sit to/from stand;sitting/lateral leans Additional ADL Goal #1: Pt will participate in pill box assessment/med management activity to facilitate optimal IADL performance   OT Frequency:  Min 2X/week    Co-evaluation              AM-PAC OT "6 Clicks" Daily Activity     Outcome Measure Help from another person eating meals?: None Help from another person taking care of personal grooming?: None Help from another person toileting, which includes using toliet, bedpan, or urinal?: None Help from another person bathing (including washing, rinsing, drying)?: A Little Help from another person to put on and taking off regular upper body clothing?: None Help from another person to put on and taking off regular lower body clothing?: A Little 6 Click Score: 22   End of Session Equipment Utilized During Treatment: Gait belt  Activity Tolerance: Patient tolerated treatment well Patient left: in bed;with call bell/phone within reach;with bed alarm set  OT Visit Diagnosis: Other abnormalities of gait and mobility (R26.89);Unsteadiness on feet (R26.81)                Time: 9147-8295 OT Time Calculation (min): 20 min Charges:  OT General Charges $OT Visit: 1 Visit OT Evaluation $OT Eval Low Complexity: 1 Low  Gerre Kraft, OTD OTR/L  07/24/23, 2:13 PM

## 2023-07-24 NOTE — Plan of Care (Signed)
 Pt transfer from ED; alert and orient x 4; RA Problem: Education: Goal: Knowledge of General Education information will improve Description: Including pain rating scale, medication(s)/side effects and non-pharmacologic comfort measures Outcome: Progressing   Problem: Health Behavior/Discharge Planning: Goal: Ability to manage health-related needs will improve Outcome: Progressing   Problem: Clinical Measurements: Goal: Ability to maintain clinical measurements within normal limits will improve Outcome: Progressing Goal: Will remain free from infection Outcome: Progressing Goal: Diagnostic test results will improve Outcome: Progressing Goal: Respiratory complications will improve Outcome: Progressing Goal: Cardiovascular complication will be avoided Outcome: Progressing   Problem: Activity: Goal: Risk for activity intolerance will decrease Outcome: Progressing   Problem: Nutrition: Goal: Adequate nutrition will be maintained Outcome: Progressing   Problem: Coping: Goal: Level of anxiety will decrease Outcome: Progressing   Problem: Elimination: Goal: Will not experience complications related to bowel motility Outcome: Progressing Goal: Will not experience complications related to urinary retention Outcome: Progressing   Problem: Pain Managment: Goal: General experience of comfort will improve and/or be controlled Outcome: Progressing   Problem: Safety: Goal: Ability to remain free from injury will improve Outcome: Progressing   Problem: Skin Integrity: Goal: Risk for impaired skin integrity will decrease Outcome: Progressing   Problem: Education: Goal: Knowledge of disease or condition will improve Outcome: Progressing Goal: Knowledge of secondary prevention will improve (MUST DOCUMENT ALL) Outcome: Progressing Goal: Knowledge of patient specific risk factors will improve (DELETE if not current risk factor) Outcome: Progressing   Problem: Ischemic Stroke/TIA  Tissue Perfusion: Goal: Complications of ischemic stroke/TIA will be minimized Outcome: Progressing   Problem: Coping: Goal: Will verbalize positive feelings about self Outcome: Progressing Goal: Will identify appropriate support needs Outcome: Progressing   Problem: Health Behavior/Discharge Planning: Goal: Ability to manage health-related needs will improve Outcome: Progressing Goal: Goals will be collaboratively established with patient/family Outcome: Progressing   Problem: Self-Care: Goal: Ability to participate in self-care as condition permits will improve Outcome: Progressing Goal: Verbalization of feelings and concerns over difficulty with self-care will improve Outcome: Progressing Goal: Ability to communicate needs accurately will improve Outcome: Progressing   Problem: Nutrition: Goal: Risk of aspiration will decrease Outcome: Progressing Goal: Dietary intake will improve Outcome: Progressing

## 2023-07-24 NOTE — ED Provider Notes (Signed)
 Patient stable.  MRI reviewed, acute stroke.  Admission.   Rebecca Carmin, MD 07/24/23 [phone]

## 2023-07-24 NOTE — Hospital Course (Signed)
 Blurry vision constant,   Dizziness when try and get up and walk around, lying down only blurry vision. Off balance when try and walk.   No blurry vision, walked to bathroom no dizziness, a little off balance.   Symptoms started Friday didn't like that she was feeling like this for so long and finally presented to the ED.

## 2023-07-25 ENCOUNTER — Observation Stay (HOSPITAL_BASED_OUTPATIENT_CLINIC_OR_DEPARTMENT_OTHER)
Admit: 2023-07-25 | Discharge: 2023-07-25 | Disposition: A | Discharge status: Home / Self Care | Attending: Family Medicine | Admitting: Family Medicine

## 2023-07-25 DIAGNOSIS — I6389 Other cerebral infarction: Secondary | ICD-10-CM

## 2023-07-25 DIAGNOSIS — I5030 Unspecified diastolic (congestive) heart failure: Secondary | ICD-10-CM | POA: Diagnosis not present

## 2023-07-25 DIAGNOSIS — D869 Sarcoidosis, unspecified: Secondary | ICD-10-CM | POA: Diagnosis not present

## 2023-07-25 DIAGNOSIS — I088 Other rheumatic multiple valve diseases: Secondary | ICD-10-CM

## 2023-07-25 DIAGNOSIS — I639 Cerebral infarction, unspecified: Secondary | ICD-10-CM | POA: Diagnosis not present

## 2023-07-25 DIAGNOSIS — I69359 Hemiplegia and hemiparesis following cerebral infarction affecting unspecified side: Secondary | ICD-10-CM | POA: Diagnosis not present

## 2023-07-25 DIAGNOSIS — I6381 Other cerebral infarction due to occlusion or stenosis of small artery: Secondary | ICD-10-CM | POA: Diagnosis not present

## 2023-07-25 LAB — ECHOCARDIOGRAM COMPLETE
AR max vel: 2.37 cm2
AV Peak grad: 5.4 mmHg
Ao pk vel: 1.16 m/s
Area-P 1/2: 2.79 cm2
Height: 64 in
S' Lateral: 2.5 cm
Weight: 2342.17 [oz_av]

## 2023-07-25 LAB — TSH: TSH: 5.322 u[IU]/mL — ABNORMAL HIGH (ref 0.350–4.500)

## 2023-07-25 MED ORDER — ASPIRIN 81 MG PO TBEC: 81.0000 mg | DELAYED_RELEASE_TABLET | Freq: Every day | ORAL | 3 refills | Status: AC

## 2023-07-25 MED ORDER — ATORVASTATIN CALCIUM 20 MG PO TABS
80.0000 mg | ORAL_TABLET | Freq: Every day | ORAL | Status: DC
  Administered 2023-07-25: 80 mg via ORAL
  Filled 2023-07-25: qty 4

## 2023-07-25 MED ORDER — ATORVASTATIN CALCIUM 80 MG PO TABS: 80.0000 mg | ORAL_TABLET | Freq: Every day | ORAL | 0 refills | Status: DC

## 2023-07-25 MED ORDER — LOSARTAN POTASSIUM 25 MG PO TABS: 25.0000 mg | ORAL_TABLET | Freq: Every day | ORAL | 0 refills | Status: DC

## 2023-07-25 MED ORDER — CLOPIDOGREL BISULFATE 75 MG PO TABS: 75.0000 mg | ORAL_TABLET | Freq: Every day | ORAL | 0 refills | Status: AC

## 2023-07-25 NOTE — Progress Notes (Signed)
 Echocardiogram 2D Echocardiogram has been performed.  Rebecca Yang 07/25/2023, 5:20 PM

## 2023-07-25 NOTE — Plan of Care (Signed)
  Problem: Education: Goal: Knowledge of General Education information will improve Description: Including pain rating scale, medication(s)/side effects and non-pharmacologic comfort measures Outcome: Progressing   Problem: Clinical Measurements: Goal: Ability to maintain clinical measurements within normal limits will improve Outcome: Progressing Goal: Will remain free from infection Outcome: Progressing Goal: Diagnostic test results will improve Outcome: Progressing Goal: Respiratory complications will improve Outcome: Progressing Goal: Cardiovascular complication will be avoided Outcome: Progressing   Problem: Nutrition: Goal: Adequate nutrition will be maintained Outcome: Progressing   Problem: Coping: Goal: Level of anxiety will decrease Outcome: Progressing   Problem: Pain Managment: Goal: General experience of comfort will improve and/or be controlled Outcome: Progressing   Problem: Safety: Goal: Ability to remain free from injury will improve Outcome: Progressing   Problem: Skin Integrity: Goal: Risk for impaired skin integrity will decrease Outcome: Progressing   Problem: Education: Goal: Knowledge of disease or condition will improve Outcome: Progressing   Problem: Ischemic Stroke/TIA Tissue Perfusion: Goal: Complications of ischemic stroke/TIA will be minimized Outcome: Progressing   Problem: Coping: Goal: Will identify appropriate support needs Outcome: Progressing   Problem: Nutrition: Goal: Risk of aspiration will decrease Outcome: Progressing Goal: Dietary intake will improve Outcome: Progressing

## 2023-07-25 NOTE — Progress Notes (Incomplete)
 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  In the ED vitals revealed BP 149/115, potassium of 3.0, otherwise all labs and vitals within normal limits.  Brain MRI revealed small acute infarct in the right periaqueductal gray matter in the region of the oculomotor nerve nucleus, it also revealed multiple chronic microhemorrhages in a predominantly central distribution consistent with chronic hypertensive angiopathy.  Neurology was consulted and the patient was admitted for stroke workup.  The patient was initiated on DAPT which she will need to continue for 3 weeks and then proceed with monotherapy aspirin thereafter.  CTA head and neck without LVO or high-grade stenosis of intracranial arteries.  Subjective: ***   Objective: Vitals:   07/24/23 1702 07/24/23 2115 07/25/23 0003 07/25/23 0504  BP: (!) 139/101 (!) 146/99 (!) 137/96 (!) 130/94  Pulse: 70 80 81 75  Resp: 18 18 18 18   Temp: 98.3 F (36.8 C) 98.1 F (36.7 C) 98.3 F (36.8 C) 97.9 F (36.6 C)  TempSrc:      SpO2: 97% 95% 96% 96%  Weight:      Height:       No intake or output data in the 24 hours ending 07/25/23 0737 Filed Weights   07/23/23 1849 07/24/23 0340  Weight: 62.6 kg 66.4 kg    Examination: General exam: Appears calm and comfortable, NAD *** Respiratory system: No work of breathing, symmetric chest wall expansion Cardiovascular system: S1 & S2 heard, RRR.  Gastrointestinal system: Abdomen is nondistended, soft and nontender.  Neuro: Alert and oriented. No focal neurological deficits. Extremities: Symmetric,  expected ROM Skin: No rashes, lesions Psychiatry: Demonstrates appropriate judgement and insight. Mood & affect appropriate for situation.   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 , ,  (Optional):26781}  Acute CVA: Acute right paramedian midbrain ischemic infarct most likely secondary to chronic hypertensive angiopathy History of thalamic hemorrhage stroke 2022 with residual left upper extremity deficits - Proceed with tight blood pressure control, will need close outpatient follow-up with PCP for medication titration - Continue atorvastatin  - Continue aspirin and Plavix x 3 weeks then monotherapy with aspirin - Echocardiogram: - Continue cardiac telemetry, no events - CTA head neck with no LVO - PT/OT Home health services ordered. - LDL 133.  Above goal. Add high intensity statin.  - Hemoglobin A1c: - TSH:  Sarcoidosis - Resume home meds  Hypertension - Continue with current medications - Will need close outpatient follow-up with PCP for further medication titration  Dizziness - Secondary to CVA as above  Hypokalemia - Resolved    DVT prophylaxis: ***   Code Status: Full Code Disposition:  ***  Consultants:  Treatment Team:  Consulting Physician: Kimberley Penman, MD  Procedures:  ***  Antimicrobials:  Anti-infectives (From admission, onward)    None       Data Reviewed: I have personally reviewed following labs and  imaging studies CBC: Recent Labs  Lab 07/23/23 1851  WBC 5.8  HGB 12.3  HCT 36.3  MCV 91.9  PLT 186   Basic Metabolic Panel: Recent Labs  Lab 07/23/23 1851 07/24/23 0458  NA 140 141  K 3.0* 3.8  CL 107 108  CO2 24 26  GLUCOSE 169* 77  BUN 11 13  CREATININE 0.91 0.88   CALCIUM  9.3 9.2   GFR: Estimated Creatinine Clearance: 63.8 mL/min (by C-G formula based on SCr of 0.88 mg/dL). Liver Function Tests: Recent Labs  Lab 07/23/23 1851  AST 17  ALT 14  ALKPHOS 66  BILITOT 0.5  PROT 7.4  ALBUMIN 3.9   CBG: No results for input(s): "GLUCAP" in the last 168 hours.  No results found for this or any previous visit (from the past 240 hours).   Radiology Studies: CT ANGIO HEAD NECK W WO CM Result Date: 07/24/2023 CLINICAL DATA:  Stroke follow-up EXAM: CT ANGIOGRAPHY HEAD AND NECK WITH AND WITHOUT CONTRAST TECHNIQUE: Multidetector CT imaging of the head and neck was performed using the standard protocol during bolus administration of intravenous contrast. Multiplanar CT image reconstructions and MIPs were obtained to evaluate the vascular anatomy. Carotid stenosis measurements (when applicable) are obtained utilizing NASCET criteria, using the distal internal carotid diameter as the denominator. RADIATION DOSE REDUCTION: This exam was performed according to the departmental dose-optimization program which includes automated exposure control, adjustment of the mA and/or kV according to patient size and/or use of iterative reconstruction technique. CONTRAST:  75mL OMNIPAQUE  IOHEXOL  350 MG/ML SOLN COMPARISON:  07/23/2023 FINDINGS: CT HEAD FINDINGS Brain: There is no mass, hemorrhage or extra-axial collection. The size and configuration of the ventricles and extra-axial CSF spaces are normal. There is hypoattenuation of the white matter, most commonly indicating chronic small vessel disease. Focus of hypoattenuation at the known site of periaqueductal gray matter infarct. Vascular: No hyperdense vessel or unexpected vascular calcification. Skull: The visualized skull base, calvarium and extracranial soft tissues are normal. Sinuses/Orbits: No fluid levels or advanced mucosal thickening of the visualized paranasal sinuses. No mastoid or middle ear effusion. Normal orbits.  CTA NECK FINDINGS Skeleton: No acute abnormality or high grade bony spinal canal stenosis. Other neck: Normal pharynx, larynx and major salivary glands. No cervical lymphadenopathy. Unremarkable thyroid gland. Upper chest: Biapical emphysema. Aortic arch: There is calcific atherosclerosis of the aortic arch. Conventional 3 vessel aortic branching pattern. RIGHT carotid system: Normal without aneurysm, dissection or stenosis. LEFT carotid system: Normal without aneurysm, dissection or stenosis. Vertebral arteries: Codominant configuration. There is no dissection, occlusion or flow-limiting stenosis to the skull base (V1-V3 segments). CTA HEAD FINDINGS POSTERIOR CIRCULATION: Vertebral arteries are normal. No proximal occlusion of the anterior or inferior cerebellar arteries. Basilar artery is normal. Superior cerebellar arteries are normal. Posterior cerebral arteries are normal. ANTERIOR CIRCULATION: Intracranial internal carotid arteries are normal. Anterior cerebral arteries are normal. Middle cerebral arteries are normal. Venous sinuses: As permitted by contrast timing, patent. Anatomic variants: None Review of the MIP images confirms the above findings. IMPRESSION: 1. No emergent large vessel occlusion or high-grade stenosis of the intracranial arteries. 2. Focus of hypoattenuation at the known site of periaqueductal gray matter infarct. Aortic Atherosclerosis (ICD10-I70.0) and Emphysema (ICD10-J43.9). Electronically Signed   By: Juanetta Nordmann M.D.   On: 07/24/2023 22:17   MR BRAIN WO CONTRAST Result Date: 07/24/2023 CLINICAL DATA:  Diplopia EXAM: MRI HEAD WITHOUT CONTRAST TECHNIQUE: Multiplanar, multiecho pulse sequences of the brain and surrounding structures were obtained without intravenous contrast.  COMPARISON:  08/11/2022 FINDINGS: Brain: There is a small acute infarct within the right periaqueductal gray matter, in the region of the oculomotor nerve nucleus. Multiple chronic microhemorrhages in a  predominantly central distribution. There is multifocal hyperintense T2-weighted signal within the white matter. Parenchymal volume and CSF spaces are normal. The midline structures are normal. Vascular: Normal flow voids. Skull and upper cervical spine: Normal calvarium and skull base. Visualized upper cervical spine and soft tissues are normal. Sinuses/Orbits:No paranasal sinus fluid levels or advanced mucosal thickening. No mastoid or middle ear effusion. Normal orbits. IMPRESSION: 1. Small acute infarct within the right periaqueductal gray matter, in the region of the oculomotor nerve nucleus. 2. Multiple chronic microhemorrhages in a predominantly central distribution, consistent with chronic hypertensive angiopathy. Electronically Signed   By: Juanetta Nordmann M.D.   On: 07/24/2023 01:39   CT HEAD WO CONTRAST ( ) Result Date: 07/23/2023 CLINICAL DATA:  Dizziness for several days, initial encounter EXAM: CT HEAD WITHOUT CONTRAST TECHNIQUE: Contiguous axial images were obtained from the base of the skull through the vertex without intravenous contrast. RADIATION DOSE REDUCTION: This exam was performed according to the departmental dose-optimization program which includes automated exposure control, adjustment of the mA and/or kV according to patient size and/or use of iterative reconstruction technique. COMPARISON:  08/11/2022 FINDINGS: Brain: No evidence of acute infarction, hemorrhage, hydrocephalus, extra-axial collection or mass lesion/mass effect. Areas of decreased attenuation are noted in the basal ganglia bilaterally as well as in the right thalamus consistent infarcts. Vascular: No hyperdense vessel or unexpected calcification. Skull: Normal. Negative for fracture or focal lesion. Sinuses/Orbits: No acute finding. Other: None. IMPRESSION: Areas of prior remote ischemia.  No acute abnormality noted. Electronically Signed   By: Violeta Grey M.D.   On: 07/23/2023 19:43    Scheduled Meds:   stroke:  early stages of recovery book   Does not apply Once   amLODipine   10 mg Oral Daily   aspirin EC  81 mg Oral Daily   atorvastatin   40 mg Oral Daily   clopidogrel  75 mg Oral Daily   enoxaparin (LOVENOX) injection  40 mg Subcutaneous Q24H   losartan   25 mg Oral Daily   Continuous Infusions:   LOS: 0 days  MDM: Patient is high risk for one or more organ failure.  They necessitate ongoing hospitalization for continued IV therapies and subsequent lab monitoring. Total time spent interpreting labs and vitals, reviewing the medical record, coordinating care amongst consultants and care team members, directly assessing and discussing care with the patient and/or family: 55 min *** Shep Porter, DO [facility]  To contact the attending physician between 7A-7P please use Epic Chat. To contact the covering physician during after hours 7P-7A, please review Amion.  07/25/2023, 7:37 AM   *This document has been created with the assistance of dictation software. Please excuse typographical errors. *

## 2023-07-25 NOTE — Plan of Care (Signed)
 86 y F h/o HTN, DM, sarcoidosis, tobacco use, and prior CVA w/ residual L motor and sensory deficits p/w blurry vision and dizziness that has since resolved.   Acute R paramedian midbrain ischemic infarct Neurology consulted. Appreciate input.   Continue DAPT, f/u cta head and neck, f/u TTE,BP managment

## 2023-07-25 NOTE — Discharge Instructions (Addendum)
 Your thyroid test was found to be abnormal. Please follow up with you primary care doctor to discuss thyroid medication if needed

## 2023-07-25 NOTE — Discharge Summary (Signed)
 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 *  Hospital Course: [name] 58 year old female with hypertension, sarcoidosis, hyperlipidemia, hemorrhagic CVA to the right thalamus in 2022 with residual left upper extremity weakness who was admitted with new onset dizziness and double vision 2 days prior to arrival.  In the ED vitals revealed BP 149/115, potassium of 3.0, otherwise all labs and vitals within normal limits.  Brain MRI revealed small acute infarct in the right periaqueductal gray matter in the region of the oculomotor nerve nucleus, it also revealed multiple chronic microhemorrhages in a predominantly central distribution consistent with chronic hypertensive angiopathy.  Neurology was consulted and the patient was admitted for stroke workup.  The patient was initiated on DAPT which she will need to continue for 3 weeks and then proceed with monotherapy aspirin thereafter.  CTA head and neck without LVO or high-grade stenosis of intracranial arteries.  TTE was mostly within normal limits, it did reveal grade 1 diastolic dysfunction. By reevaluation on 5/14 patient was still endorsing some occasional dizziness but was requesting to discharge home.  Home health PT/OT was ordered prior to discharge  Acute CVA: Acute right paramedian  midbrain ischemic infarct most likely secondary to chronic hypertensive angiopathy History of thalamic hemorrhage stroke 2022 with residual left upper extremity deficits - Proceed with tight blood pressure control, will need close outpatient follow-up with PCP for medication titration - Continue atorvastatin  - Continue aspirin and Plavix x 3 weeks then monotherapy with aspirin - Echocardiogram: LVEF 55 to 60%, no regional wall motion abnormalities, grade 1 diastolic dysfunction.  Normal pulmonary arterial pressures. - Continue cardiac telemetry, no events - CTA head neck with no LVO - PT/OT Home health services ordered. - LDL 133.  Above goal. Add high intensity statin.  - Hemoglobin A1c: pending - TSH: elevated, T4 pending   Sarcoidosis - Resume home meds   Hypertension - Continue with current medications - Will need close outpatient follow-up with PCP for further medication titration   Dizziness, improving - Secondary to CVA as above   Hypokalemia - Resolved  Abnormal thyroid testing - TSH elevated, T4 still pending at time of discharge. - Needs PCP follow-up, may require levothyroxine initiation  Heart failure with preserved EF, grade 1 diastolic dysfunction seen on echocardiogram - Clinically euvolemic    Consultants: Neurology Procedures performed: n/a  Disposition: Home health Diet recommendation:  Discharge Diet Orders (From admission, onward)     Start     Ordered   07/25/23 0000  Diet general        07/25/23 1728           Cardiac diet DISCHARGE MEDICATION: Allergies as of 07/25/2023       Reactions   Penicillins Hives        Medication List     TAKE these medications    acetaminophen  325 MG tablet Commonly known as:  TYLENOL  Take 650 mg by mouth every 6 (six) hours as needed.   amLODipine  10 MG tablet Commonly known as: NORVASC  Take 1 tablet by mouth once daily   aspirin EC 81 MG tablet Take 1 tablet (81 mg total) by mouth daily.  Swallow whole. Start taking on: Jul 26, 2023   atorvastatin  80 MG tablet Commonly known as: LIPITOR Take 1 tablet (80 mg total) by mouth daily. Start taking on: Jul 26, 2023 What changed:  medication strength how much to take   clopidogrel 75 MG tablet Commonly known as: PLAVIX Take 1 tablet (75 mg total) by mouth daily for 21 days. Start taking on: Jul 26, 2023   losartan  25 MG tablet Commonly known as: COZAAR  Take 1 tablet (25 mg total) by mouth daily.   metFORMIN  500 MG tablet Commonly known as: GLUCOPHAGE  Take 1 tablet (500 mg total) by mouth daily with breakfast.   nicotine  polacrilex 2 MG gum Commonly known as: NICORETTE  Take 1 each (2 mg total) by mouth as needed for smoking cessation.        Follow-up Information     Bufford Carne, MD.   Specialty: Neurology Contact information: [phone] [facility] [address] [facility] [place] [address] [phone]         [place], [facility].   Specialty: Optometry Contact information: [address] [place] [address] [phone]                Discharge Exam: [name] Weights   07/23/23 1849 07/24/23 0340  Weight: 62.6 kg 66.4 kg   Constitutional:  Normal appearance. Non toxic-appearing.  HENT: Head Normocephalic and atraumatic.  Mucous membranes are moist.  Eyes:  Extraocular intact. Conjunctivae normal. Pupils are equal, round, and reactive to light.  Cardiovascular: Rate and Rhythm: Normal rate and regular rhythm.  Pulmonary: Non labored, symmetric rise of chest wall.  Musculoskeletal:  Normal range of motion.  Skin: warm and dry. not jaundiced.  Neurological: No focal deficit present. alert. Oriented. Psychiatric: Mood and Affect congruent.    Condition at discharge: stable  The results of significant diagnostics from this hospitalization (including imaging, microbiology, ancillary and laboratory) are listed below for reference.   Imaging Studies: ECHOCARDIOGRAM  COMPLETE Result Date: 07/25/2023    ECHOCARDIOGRAM REPORT   Patient Name:   [name] Date of Exam: 07/25/2023 Medical Rec #:  [id number]        Height:       64.0 in Accession #:    [id number]       Weight:       146.4 lb Date of Birth:  [birth year]        BSA:          1.713 m Patient Age:    58 years         BP:           126/102 mmHg Patient Gender: F                HR:           85 bpm. Exam Location:  [facility] Procedure: 2D Echo, Cardiac Doppler, Color Doppler and Strain Analysis (Both            Spectral and Color Flow Doppler were utilized during procedure). Indications:     Stroke I63.9  History:         Patient has no prior history of Echocardiogram examinations.  Stroke and COPD; Risk Factors:Hypertension, Diabetes,                  Dyslipidemia and Current Smoker.  Sonographer:     Terrilee Few RCS Referring Phys:  [id number] Roise Cleaver Diagnosing Phys: Belva Boyden MD IMPRESSIONS  1. Left ventricular ejection fraction, by estimation, is 55 to 60%. The left ventricle has normal function. The left ventricle has no regional wall motion abnormalities. Left ventricular diastolic parameters are consistent with Grade I diastolic dysfunction (impaired relaxation).  2. Right ventricular systolic function is normal. The right ventricular size is normal. There is normal pulmonary artery systolic pressure. The estimated right ventricular systolic pressure is 29.8 mmHg.  3. The mitral valve is normal in structure. Mild mitral valve regurgitation. No evidence of mitral stenosis.  4. Tricuspid valve regurgitation is mild to moderate.  5. The aortic valve is normal in structure. Aortic valve regurgitation is not visualized. No aortic stenosis is present.  6. The inferior vena cava is normal in size with greater than 50% respiratory variability, suggesting right atrial pressure of 3 mmHg. FINDINGS  Left Ventricle: Left ventricular ejection fraction, by estimation, is 55 to 60%. The left  ventricle has normal function. The left ventricle has no regional wall motion abnormalities. Strain was performed and the global longitudinal strain is indeterminate. The left ventricular internal cavity size was normal in size. There is no left ventricular hypertrophy. Left ventricular diastolic parameters are consistent with Grade I diastolic dysfunction (impaired relaxation). Right Ventricle: The right ventricular size is normal. No increase in right ventricular wall thickness. Right ventricular systolic function is normal. There is normal pulmonary artery systolic pressure. The tricuspid regurgitant velocity is 2.49 m/s, and  with an assumed right atrial pressure of 5 mmHg, the estimated right ventricular systolic pressure is 29.8 mmHg. Left Atrium: Left atrial size was normal in size. Right Atrium: Right atrial size was normal in size. Pericardium: There is no evidence of pericardial effusion. Mitral Valve: The mitral valve is normal in structure. Mild mitral valve regurgitation. No evidence of mitral valve stenosis. Tricuspid Valve: The tricuspid valve is normal in structure. Tricuspid valve regurgitation is mild to moderate. No evidence of tricuspid stenosis. Aortic Valve: The aortic valve is normal in structure. Aortic valve regurgitation is not visualized. No aortic stenosis is present. Aortic valve peak gradient measures 5.4 mmHg. Pulmonic Valve: The pulmonic valve was normal in structure. Pulmonic valve regurgitation is mild. No evidence of pulmonic stenosis. Aorta: The aortic root is normal in size and structure. Venous: The inferior vena cava is normal in size with greater than 50% respiratory variability, suggesting right atrial pressure of 3 mmHg. IAS/Shunts: No atrial level shunt detected by color flow Doppler. Additional Comments: 3D was performed not requiring image post processing on an independent workstation and was indeterminate.  LEFT VENTRICLE PLAX 2D LVIDd:         3.80 cm   Diastology  LVIDs:         2.50 cm   LV e' medial:    5.87 cm/s LV PW:         1.20 cm   LV E/e' medial:  7.5 LV IVS:        0.60 cm   LV e' lateral:   6.20 cm/s LVOT diam:     2.05 cm   LV E/e' lateral: 7.1 LV SV:         50 LV SV Index:   29 LVOT Area:  3.30 cm  RIGHT VENTRICLE            IVC RV S prime:     9.57 cm/s  IVC diam: 1.40 cm TAPSE (M-mode): 1.8 cm LEFT ATRIUM             Index        RIGHT ATRIUM           Index LA diam:        2.20 cm 1.28 cm/m   RA Area:     11.80 cm LA Vol (A2C):   23.1 ml 13.48 ml/m  RA Volume:   27.50 ml  16.05 ml/m LA Vol (A4C):   22.8 ml 13.31 ml/m LA Biplane Vol: 23.7 ml 13.83 ml/m  AORTIC VALVE                 PULMONIC VALVE AV Area (Vmax): 2.37 cm     PR End Diast Vel: 4.16 msec AV Vmax:        116.00 cm/s AV Peak Grad:   5.4 mmHg LVOT Vmax:      83.30 cm/s LVOT Vmean:     52.400 cm/s LVOT VTI:       0.150 m  AORTA Ao Root diam: 3.50 cm Ao Asc diam:  3.50 cm MITRAL VALVE               TRICUSPID VALVE MV Area (PHT): 2.79 cm    TR Peak grad:   24.8 mmHg MV Decel Time: 272 msec    TR Vmax:        249.00 cm/s MV E velocity: 44.05 cm/s MV A velocity: 70.20 cm/s  SHUNTS MV E/A ratio:  0.63        Systemic VTI:  0.15 m                            Systemic Diam: 2.05 cm Belva Boyden MD Electronically signed by Belva Boyden MD Signature Date/Time: 07/25/2023/5:28:38 PM    Final    CT ANGIO HEAD NECK W WO CM Result Date: 07/24/2023 CLINICAL DATA:  Stroke follow-up EXAM: CT ANGIOGRAPHY HEAD AND NECK WITH AND WITHOUT CONTRAST TECHNIQUE: Multidetector CT imaging of the head and neck was performed using the standard protocol during bolus administration of intravenous contrast. Multiplanar CT image reconstructions and MIPs were obtained to evaluate the vascular anatomy. Carotid stenosis measurements (when applicable) are obtained utilizing NASCET criteria, using the distal internal carotid diameter as the denominator. RADIATION DOSE REDUCTION: This exam was performed according to the  departmental dose-optimization program which includes automated exposure control, adjustment of the mA and/or kV according to patient size and/or use of iterative reconstruction technique. CONTRAST:  75mL OMNIPAQUE  IOHEXOL  350 MG/ML SOLN COMPARISON:  07/23/2023 FINDINGS: CT HEAD FINDINGS Brain: There is no mass, hemorrhage or extra-axial collection. The size and configuration of the ventricles and extra-axial CSF spaces are normal. There is hypoattenuation of the white matter, most commonly indicating chronic small vessel disease. Focus of hypoattenuation at the known site of periaqueductal gray matter infarct. Vascular: No hyperdense vessel or unexpected vascular calcification. Skull: The visualized skull base, calvarium and extracranial soft tissues are normal. Sinuses/Orbits: No fluid levels or advanced mucosal thickening of the visualized paranasal sinuses. No mastoid or middle ear effusion. Normal orbits. CTA NECK FINDINGS Skeleton: No acute abnormality or high grade bony spinal canal stenosis. Other neck: Normal pharynx, larynx and major salivary glands. No cervical lymphadenopathy. Unremarkable thyroid gland.  Upper chest: Biapical emphysema. Aortic arch: There is calcific atherosclerosis of the aortic arch. Conventional 3 vessel aortic branching pattern. RIGHT carotid system: Normal without aneurysm, dissection or stenosis. LEFT carotid system: Normal without aneurysm, dissection or stenosis. Vertebral arteries: Codominant configuration. There is no dissection, occlusion or flow-limiting stenosis to the skull base (V1-V3 segments). CTA HEAD FINDINGS POSTERIOR CIRCULATION: Vertebral arteries are normal. No proximal occlusion of the anterior or inferior cerebellar arteries. Basilar artery is normal. Superior cerebellar arteries are normal. Posterior cerebral arteries are normal. ANTERIOR CIRCULATION: Intracranial internal carotid arteries are normal. Anterior cerebral arteries are normal. Middle cerebral  arteries are normal. Venous sinuses: As permitted by contrast timing, patent. Anatomic variants: None Review of the MIP images confirms the above findings. IMPRESSION: 1. No emergent large vessel occlusion or high-grade stenosis of the intracranial arteries. 2. Focus of hypoattenuation at the known site of periaqueductal gray matter infarct. Aortic Atherosclerosis (ICD10-I70.0) and Emphysema (ICD10-J43.9). Electronically Signed   By: Juanetta Nordmann M.D.   On: 07/24/2023 22:17   MR BRAIN WO CONTRAST Result Date: 07/24/2023 CLINICAL DATA:  Diplopia EXAM: MRI HEAD WITHOUT CONTRAST TECHNIQUE: Multiplanar, multiecho pulse sequences of the brain and surrounding structures were obtained without intravenous contrast. COMPARISON:  08/11/2022 FINDINGS: Brain: There is a small acute infarct within the right periaqueductal gray matter, in the region of the oculomotor nerve nucleus. Multiple chronic microhemorrhages in a predominantly central distribution. There is multifocal hyperintense T2-weighted signal within the white matter. Parenchymal volume and CSF spaces are normal. The midline structures are normal. Vascular: Normal flow voids. Skull and upper cervical spine: Normal calvarium and skull base. Visualized upper cervical spine and soft tissues are normal. Sinuses/Orbits:No paranasal sinus fluid levels or advanced mucosal thickening. No mastoid or middle ear effusion. Normal orbits. IMPRESSION: 1. Small acute infarct within the right periaqueductal gray matter, in the region of the oculomotor nerve nucleus. 2. Multiple chronic microhemorrhages in a predominantly central distribution, consistent with chronic hypertensive angiopathy. Electronically Signed   By: Juanetta Nordmann M.D.   On: 07/24/2023 01:39   CT HEAD WO CONTRAST ( ) Result Date: 07/23/2023 CLINICAL DATA:  Dizziness for several days, initial encounter EXAM: CT HEAD WITHOUT CONTRAST TECHNIQUE: Contiguous axial images were obtained from the base of the skull  through the vertex without intravenous contrast. RADIATION DOSE REDUCTION: This exam was performed according to the departmental dose-optimization program which includes automated exposure control, adjustment of the mA and/or kV according to patient size and/or use of iterative reconstruction technique. COMPARISON:  08/11/2022 FINDINGS: Brain: No evidence of acute infarction, hemorrhage, hydrocephalus, extra-axial collection or mass lesion/mass effect. Areas of decreased attenuation are noted in the basal ganglia bilaterally as well as in the right thalamus consistent infarcts. Vascular: No hyperdense vessel or unexpected calcification. Skull: Normal. Negative for fracture or focal lesion. Sinuses/Orbits: No acute finding. Other: None. IMPRESSION: Areas of prior remote ischemia.  No acute abnormality noted. Electronically Signed   By: Violeta Grey M.D.   On: 07/23/2023 19:43    Microbiology: Results for orders placed or performed during the hospital encounter of 07/08/20  Resp Panel by RT-PCR (Flu A&B, Covid) Nasopharyngeal Swab     Status: None   Collection Time: 07/08/20 11:55 PM   Specimen: Nasopharyngeal Swab; Nasopharyngeal(NP) swabs in vial transport medium  Result Value Ref Range Status   SARS Coronavirus 2 by RT PCR NEGATIVE NEGATIVE Final    Comment: (NOTE) SARS-CoV-2 target nucleic acids are NOT DETECTED.  The SARS-CoV-2 RNA is generally detectable in upper  respiratory specimens during the acute phase of infection. The lowest concentration of SARS-CoV-2 viral copies this assay can detect is 138 copies/mL. A negative result does not preclude SARS-Cov-2 infection and should not be used as the sole basis for treatment or other patient management decisions. A negative result may occur with  improper specimen collection/handling, submission of specimen other than nasopharyngeal swab, presence of viral mutation(s) within the areas targeted by this assay, and inadequate number of  viral copies(<138 copies/mL). A negative result must be combined with clinical observations, patient history, and epidemiological information. The expected result is Negative.  Fact Sheet for Patients:  BloggerCourse.com  Fact Sheet for Healthcare Providers:  SeriousBroker.it  This test is no t yet approved or cleared by the [place]  FDA and  has been authorized for detection and/or diagnosis of SARS-CoV-2 by FDA under an Emergency Use Authorization (EUA). This EUA will remain  in effect (meaning this test can be used) for the duration of the COVID-19 declaration under Section 564(b)(1) of the Act, 21 U.S.C.section 360bbb-3(b)(1), unless the authorization is terminated  or revoked sooner.       Influenza A by PCR NEGATIVE NEGATIVE Final   Influenza B by PCR NEGATIVE NEGATIVE Final    Comment: (NOTE) The Xpert Xpress SARS-CoV-2/FLU/RSV plus assay is intended as an aid in the diagnosis of influenza from Nasopharyngeal swab specimens and should not be used as a sole basis for treatment. Nasal washings and aspirates are unacceptable for Xpert Xpress SARS-CoV-2/FLU/RSV testing.  Fact Sheet for Patients: BloggerCourse.com  Fact Sheet for Healthcare Providers: SeriousBroker.it  This test is not yet approved or cleared by the [place]  FDA and has been authorized for detection and/or diagnosis of SARS-CoV-2 by FDA under an Emergency Use Authorization (EUA). This EUA will remain in effect (meaning this test can be used) for the duration of the COVID-19 declaration under Section 564(b)(1) of the Act, 21 U.S.C. section 360bbb-3(b)(1), unless the authorization is terminated or revoked.  Performed at [facility], [address] [place], [place] [address]     Labs: CBC: Recent Labs  Lab 07/23/23 1851  WBC 5.8  HGB 12.3  HCT 36.3  MCV 91.9  PLT 186    Basic Metabolic Panel: Recent Labs  Lab 07/23/23 1851 07/24/23 0458  NA 140 141  K 3.0* 3.8  CL 107 108  CO2 24 26  GLUCOSE 169* 77  BUN 11 13  CREATININE 0.91 0.88  CALCIUM  9.3 9.2   Liver Function Tests: Recent Labs  Lab 07/23/23 1851  AST 17  ALT 14  ALKPHOS 66  BILITOT 0.5  PROT 7.4  ALBUMIN 3.9   CBG: No results for input(s): "GLUCAP" in the last 168 hours.  Discharge time spent: 32 minutes.  Signed: Zarayah Lanting, DO [facility] 07/25/2023

## 2023-07-25 NOTE — Progress Notes (Signed)
 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 Pt is agreeable to OT tx session targeting improving functional activity tolerance/ADL performance. See flowsheet below for further details, but improved functional mobility requiring CGA with RW, frequent vcs for technique. Toileting completed with supervision. Discussed med management with patient, noted improved insight but may benefit from further assessment/assistance for med management after discharge. Pt is left in bedside chair, all needs met. OT will continue to follow      If plan is discharge home, recommend the following:  A little help with walking and/or transfers;A little help with bathing/dressing/bathroom;Direct supervision/assist for medications management;Direct supervision/assist for financial management   Equipment Recommendations  BSC/3in1;Tub/shower seat    Recommendations for Other Services      Precautions / Restrictions Precautions Precautions: Fall Recall of Precautions/Restrictions: Impaired Restrictions Weight Bearing Restrictions Per Provider Order: No       Mobility Bed Mobility Overal bed mobility: Modified Independent                  Transfers Overall transfer level: Needs assistance Equipment used: Rolling walker (2 wheels) Transfers: Sit to/from  Stand Sit to Stand: Contact guard assist                 Balance Overall balance assessment: Needs assistance Sitting-balance support: Feet supported, No upper extremity supported Sitting balance-Leahy Scale: Good     Standing balance support: Bilateral upper extremity supported, During functional activity, Reliant on assistive device for balance Standing balance-Leahy Scale: Fair                             ADL either performed or assessed with clinical judgement   ADL Overall ADL's : Needs assistance/impaired     Grooming: Contact guard assist;Wash/dry hands;Standing Grooming Details (indicate cue type and reason): at sink level with RW, frequent vcs for RW technique         Upper Body Dressing : Minimal assistance;Sitting Upper Body Dressing Details (indicate cue type and reason): doff/donn gown Lower Body Dressing: Minimal assistance Lower Body Dressing Details (indicate cue type and reason): donn shoes Toilet Transfer: Contact guard assist;Rolling walker (2 wheels) Toilet Transfer Details (indicate cue type and reason): frequent vcs for technique Toileting- Clothing Manipulation and Hygiene: Supervision/safety;Sitting/lateral lean Toileting - Clothing Manipulation Details (indicate cue type and reason): continent urine on toilet     Functional mobility during ADLs: Contact guard assist;Cueing for sequencing;Rolling walker (2 wheels) (approx 15' in room)      Extremity/Trunk Assessment              Vision       Perception     Praxis     Communication Communication Communication: No apparent difficulties   Cognition Arousal: Alert Behavior During Therapy: Flat affect Cognition: No family/caregiver present to determine baseline, Cognition impaired     Awareness: Online awareness impaired  Attention impairment (select first level of impairment): Selective attention Executive functioning impairment (select all impairments): Problem  solving OT - Cognition Comments: noted improvements from yesterday                 Following commands: Impaired Following commands impaired: Follows one step commands with increased time      Cueing   Cueing Techniques: Verbal cues, Tactile cues  Exercises Other Exercises Other Exercises: edu re: role of OT, role of rehab, discharge recommendations    Shoulder Instructions       General Comments vss throughout, dizziness reported throughout, unchanged with mobility    Pertinent Vitals/ Pain       Pain Assessment Pain Assessment: No/denies pain  Home Living                                          Prior Functioning/Environment              Frequency  Min 2X/week        Progress Toward Goals  OT Goals(current goals can now be found in the care plan section)  Progress towards OT goals: Progressing toward goals  Acute Rehab OT Goals Time For Goal Achievement: 08/07/23  Plan      Co-evaluation                 AM-PAC OT "6 Clicks" Daily Activity     Outcome Measure   Help from another person eating meals?: None Help from another person taking care of personal grooming?: None Help from another person toileting, which includes using toliet, bedpan, or urinal?: None Help from another person bathing (including washing, rinsing, drying)?: A Little Help from another person to put on and taking off regular upper body clothing?: None Help from another person to put on and taking off regular lower body clothing?: A Little 6 Click Score: 22    End of Session Equipment Utilized During Treatment: Gait belt;Rolling walker (2 wheels)  OT Visit Diagnosis: Other abnormalities of gait and mobility (R26.89);Unsteadiness on feet (R26.81)   Activity Tolerance Patient tolerated treatment well   Patient Left in chair;with call bell/phone within reach;with chair alarm set;with nursing/sitter in room   Nurse Communication Mobility status         Time: 1610-9604 OT Time Calculation (min): 18 min  Charges: OT General Charges $OT Visit: 1 Visit OT Treatments $Self Care/Home Management : 8-22 mins  Gerre Kraft, OTD OTR/L  07/25/23, 11:31 AM

## 2023-07-26 LAB — HEMOGLOBIN A1C
Hgb A1c MFr Bld: 5.8 % — ABNORMAL HIGH (ref 4.8–5.6)
Mean Plasma Glucose: 120 mg/dL

## 2023-07-26 NOTE — Progress Notes (Signed)
 Pt d/c order delayed overnight due to no access to taxi once d/c order placed or  overnight. Taxi contacted in a.m. and transportation arranged.

## 2023-07-27 LAB — T4: T4, Total: 10 ug/dL (ref 4.5–12.0)

## 2023-08-03 ENCOUNTER — Ambulatory Visit: Payer: Self-pay

## 2023-08-03 NOTE — Telephone Encounter (Signed)
 Called pt to offer her an appt on 08/10/2023 and she declined and said 08/15/2023 works for her.

## 2023-08-03 NOTE — Telephone Encounter (Signed)
 Copied from CRM [id number]. Topic: Clinical - Red Word Triage >> Aug 03, 2023 12:52 PM Chrystal Crape R wrote: Pt calling to schedule hospital follow up for a stroke. Discharge from May 15th. Still having trouble with balance and walking. Decision tree deny scheduling    Chief Complaint: Hospital Follow up Symptoms: ------- Frequency: ------ Pertinent Negatives: Patient denies any changes or any new complaints Disposition: [] ED /[] Urgent Care (no appt availability in office) / [x] Appointment(In office/virtual)/ []  [place] [facility]/ [] Home Care/ [] Refused Recommended Disposition /[] [facility]/ []  Follow-up with PCP Additional Notes: Patient may need a sooner appointment based on discharge instructions about medications from the hospital  Patient called and advised that she was in the hospital May 12th through the 15th and she was admitted for a stroke.  Patient states that she has issues with her balance and issues with her eyes that were going on while she was in the hospital and she has to follow up with Neurology and Optometry. Patient was also advised to follow up with her PCP.  Hospital discharge notes state close follow up with PCP.  Patient states that nothing has changed since she was discharged. Patient states that she has balance issues but they were going on while she was in the hospital Patient states she feels the same. She denies any new symptoms.  No appointments with patient's PCP were available in the next two weeks so patient  This RN called the [facility] to see about any sooner appointments.  It was advised that there was a sooner appointment with another provider in the practice so this RN made patient an appointment with Dr Branson Calandra on 08/15/2023 at 2pm. Patient is advised to call us  if anything changes or she needs anything at all. Patient verbalized understanding. This RN asked patient to make sure that she didn't feel like anything was different and she was  alert & oriented, answering questions appropriately and stated that she was only calling to let her PCP office know that she was in the hospital.       Reason for Disposition  [1] Follow-up call to recent contact AND [2] information only call, no triage required  Protocols used: Information Only Call - No Triage-A-AH

## 2023-08-15 ENCOUNTER — Inpatient Hospital Stay: Admitting: Family Medicine

## 2023-08-15 NOTE — Progress Notes (Deleted)
      Established patient visit   Patient: Rebecca Yang   DOB: 1965/04/19   58 y.o. Female  MRN: [id number] Visit Date: 08/15/2023  Today's healthcare provider: Mimi Alt, MD   No chief complaint on file.  Subjective       Discussed the use of AI scribe software for clinical note transcription with the patient, who gave verbal consent to proceed.  History of Present Illness      Past Medical History:  Diagnosis Date   Hyperlipidemia    Hypertension    Neuropathy    Sarcoidosis    Stroke (HCC)     Medications: Outpatient Medications Prior to Visit  Medication Sig   acetaminophen  (TYLENOL ) 325 MG tablet Take 650 mg by mouth every 6 (six) hours as needed.   amLODipine  (NORVASC ) 10 MG tablet Take 1 tablet by mouth once daily (Patient not taking: Reported on 07/24/2023)   aspirin  EC 81 MG tablet Take 1 tablet (81 mg total) by mouth daily. Swallow whole.   atorvastatin  (LIPITOR) 80 MG tablet Take 1 tablet (80 mg total) by mouth daily.   clopidogrel  (PLAVIX ) 75 MG tablet Take 1 tablet (75 mg total) by mouth daily for 21 days.   losartan  (COZAAR ) 25 MG tablet Take 1 tablet (25 mg total) by mouth daily.   metFORMIN  (GLUCOPHAGE ) 500 MG tablet Take 1 tablet (500 mg total) by mouth daily with breakfast. (Patient not taking: Reported on 07/24/2023)   nicotine  polacrilex (NICORETTE ) 2 MG gum Take 1 each (2 mg total) by mouth as needed for smoking cessation.   No facility-administered medications prior to visit.    Review of Systems  {Insert previous labs (optional):23779} {See past labs  Heme  Chem  Endocrine  Serology  Results Review (optional):1}   Objective    LMP 01/23/2016 (Approximate)  {Insert last BP/Wt (optional):23777}{See vitals history (optional):1}    Physical Exam  ***  No results found for any visits on 08/15/23.  Assessment & Plan     Problem List Items Addressed This Visit   None   Assessment and Plan Assessment &  Plan      No follow-ups on file.         Mimi Alt, MD  [facility] [phone] (phone) [phone] (fax)  [facility]

## 2023-08-29 NOTE — Progress Notes (Signed)
 Established patient visit  Patient: Rebecca Yang   DOB: 02-08-1966   58 y.o. Female  MRN: [id number] Visit Date: 08/30/2023  Today's healthcare provider: Blane Bunting, PA-C   No chief complaint on file.  Subjective       Discussed the use of AI scribe software for clinical note transcription with the patient, who gave verbal consent to proceed.  History of Present Illness        07/17/2022    1:25 PM 06/13/2022    1:44 PM 02/28/2022    1:56 PM  Depression screen PHQ 2/9  Decreased Interest 0 0 0  Down, Depressed, Hopeless 1 1 0  PHQ - 2 Score 1 1 0  Altered sleeping 2 1 0  Tired, decreased energy 2 1 0  Change in appetite 0 1 0  Feeling bad or failure about yourself  0 0 0  Trouble concentrating 0 0 0  Moving slowly or fidgety/restless 0 0 0  Suicidal thoughts 0 0 0  PHQ-9 Score 5 4 0  Difficult doing work/chores Not difficult at all Not difficult at all Not difficult at all       No data to display          Medications: Outpatient Medications Prior to Visit  Medication Sig   acetaminophen  (TYLENOL ) 325 MG tablet Take 650 mg by mouth every 6 (six) hours as needed.   amLODipine  (NORVASC ) 10 MG tablet Take 1 tablet by mouth once daily (Patient not taking: Reported on 07/24/2023)   aspirin  EC 81 MG tablet Take 1 tablet (81 mg total) by mouth daily. Swallow whole.   atorvastatin  (LIPITOR) 80 MG tablet Take 1 tablet (80 mg total) by mouth daily.   losartan  (COZAAR ) 25 MG tablet Take 1 tablet (25 mg total) by mouth daily.   metFORMIN  (GLUCOPHAGE ) 500 MG tablet Take 1 tablet (500 mg total) by mouth daily with breakfast. (Patient not taking: Reported on 07/24/2023)   nicotine  polacrilex (NICORETTE ) 2 MG gum Take 1 each (2 mg total) by mouth as needed for smoking cessation.   No facility-administered medications prior to visit.    Review of Systems  All other systems reviewed and are negative.  All negative Except see HPI   {Insert previous labs  (optional):23779} {See past labs  Heme  Chem  Endocrine  Serology  Results Review (optional):1}   Objective    LMP 01/23/2016 (Approximate)  {Insert last BP/Wt (optional):23777}{See vitals history (optional):1}   Physical Exam Vitals reviewed.  Constitutional:      General: She is not in acute distress.    Appearance: Normal appearance. She is well-developed. She is not diaphoretic.  HENT:     Head: Normocephalic and atraumatic.   Eyes:     General: No scleral icterus.    Conjunctiva/sclera: Conjunctivae normal.   Neck:     Thyroid : No thyromegaly.   Cardiovascular:     Rate and Rhythm: Normal rate and regular rhythm.     Pulses: Normal pulses.     Heart sounds: Normal heart sounds. No murmur heard. Pulmonary:     Effort: Pulmonary effort is normal. No respiratory distress.     Breath sounds: Normal breath sounds. No wheezing, rhonchi or rales.   Musculoskeletal:     Cervical back: Neck supple.     Right lower leg: No edema.     Left lower leg: No edema.  Lymphadenopathy:     Cervical: No cervical adenopathy.   Skin:    General: Skin  is warm and dry.     Findings: No rash.   Neurological:     Mental Status: She is alert and oriented to person, place, and time. Mental status is at baseline.   Psychiatric:        Mood and Affect: Mood normal.        Behavior: Behavior normal.      No results found for any visits on 08/30/23.      Assessment and Plan Assessment & Plan     No orders of the defined types were placed in this encounter.   No follow-ups on file.   The patient was advised to call back or seek an in-person evaluation if the symptoms worsen or if the condition fails to improve as anticipated.  I discussed the assessment and treatment plan with the patient. The patient was provided an opportunity to ask questions and all were answered. The patient agreed with the plan and demonstrated an understanding of the instructions.  I, Arlow Spiers, PA-C have reviewed all documentation for this visit. The documentation on 08/30/2023  for the exam, diagnosis, procedures, and orders are all accurate and complete.  Blane Bunting, [facility], MMS [facility] [phone] (phone) [phone] (fax)  [facility]

## 2023-08-30 ENCOUNTER — Encounter: Payer: Self-pay | Admitting: Physician Assistant

## 2023-08-30 ENCOUNTER — Ambulatory Visit (INDEPENDENT_AMBULATORY_CARE_PROVIDER_SITE_OTHER): Admitting: Physician Assistant

## 2023-08-30 VITALS — BP 120/65 | HR 82 | Resp 16 | Wt 139.0 lb

## 2023-08-30 DIAGNOSIS — Z716 Tobacco abuse counseling: Secondary | ICD-10-CM

## 2023-08-30 DIAGNOSIS — E1159 Type 2 diabetes mellitus with other circulatory complications: Secondary | ICD-10-CM

## 2023-08-30 DIAGNOSIS — F339 Major depressive disorder, recurrent, unspecified: Secondary | ICD-10-CM

## 2023-08-30 DIAGNOSIS — Z720 Tobacco use: Secondary | ICD-10-CM

## 2023-08-30 DIAGNOSIS — Z7984 Long term (current) use of oral hypoglycemic drugs: Secondary | ICD-10-CM | POA: Diagnosis not present

## 2023-08-30 DIAGNOSIS — E119 Type 2 diabetes mellitus without complications: Secondary | ICD-10-CM | POA: Diagnosis not present

## 2023-08-30 DIAGNOSIS — Z8673 Personal history of transient ischemic attack (TIA), and cerebral infarction without residual deficits: Secondary | ICD-10-CM

## 2023-08-30 DIAGNOSIS — D869 Sarcoidosis, unspecified: Secondary | ICD-10-CM

## 2023-08-30 DIAGNOSIS — E785 Hyperlipidemia, unspecified: Secondary | ICD-10-CM

## 2023-08-30 DIAGNOSIS — I152 Hypertension secondary to endocrine disorders: Secondary | ICD-10-CM

## 2023-08-30 DIAGNOSIS — R269 Unspecified abnormalities of gait and mobility: Secondary | ICD-10-CM

## 2023-08-30 DIAGNOSIS — J449 Chronic obstructive pulmonary disease, unspecified: Secondary | ICD-10-CM

## 2023-08-30 DIAGNOSIS — Z5986 Financial insecurity: Secondary | ICD-10-CM

## 2023-08-30 DIAGNOSIS — R202 Paresthesia of skin: Secondary | ICD-10-CM

## 2023-08-30 DIAGNOSIS — Z5941 Food insecurity: Secondary | ICD-10-CM

## 2023-08-30 MED ORDER — NICOTINE POLACRILEX 2 MG MT GUM
2.0000 mg | CHEWING_GUM | OROMUCOSAL | 0 refills | Status: DC | PRN
Start: 2023-08-30 — End: 2023-12-07

## 2023-08-31 ENCOUNTER — Ambulatory Visit: Payer: Self-pay | Admitting: Physician Assistant

## 2023-08-31 LAB — T4, FREE: Free T4: 1.17 ng/dL (ref 0.82–1.77)

## 2023-08-31 LAB — CBC WITH DIFFERENTIAL/PLATELET
Basophils Absolute: 0 10*3/uL (ref 0.0–0.2)
Basos: 1 %
EOS (ABSOLUTE): 0.1 10*3/uL (ref 0.0–0.4)
Eos: 2 %
Hematocrit: 39 % (ref 34.0–46.6)
Hemoglobin: 12.4 g/dL (ref 11.1–15.9)
Immature Grans (Abs): 0 10*3/uL (ref 0.0–0.1)
Immature Granulocytes: 0 %
Lymphocytes Absolute: 2.2 10*3/uL (ref 0.7–3.1)
Lymphs: 38 %
MCH: 30.7 pg (ref 26.6–33.0)
MCHC: 31.8 g/dL (ref 31.5–35.7)
MCV: 97 fL (ref 79–97)
Monocytes Absolute: 0.5 10*3/uL (ref 0.1–0.9)
Monocytes: 9 %
Neutrophils Absolute: 2.9 10*3/uL (ref 1.4–7.0)
Neutrophils: 50 %
Platelets: 208 10*3/uL (ref 150–450)
RBC: 4.04 x10E6/uL (ref 3.77–5.28)
RDW: 13.2 % (ref 11.7–15.4)
WBC: 5.7 10*3/uL (ref 3.4–10.8)

## 2023-08-31 LAB — MICROALBUMIN / CREATININE URINE RATIO
Creatinine, Urine: 179.8 mg/dL
Microalb/Creat Ratio: 23 mg/g{creat} (ref 0–29)
Microalbumin, Urine: 40.5 ug/mL

## 2023-08-31 LAB — COMPREHENSIVE METABOLIC PANEL WITH GFR
ALT: 10 IU/L (ref 0–32)
AST: 17 IU/L (ref 0–40)
Albumin: 4.4 g/dL (ref 3.8–4.9)
Alkaline Phosphatase: 99 IU/L (ref 44–121)
BUN/Creatinine Ratio: 12 (ref 12–28)
BUN: 13 mg/dL (ref 8–27)
Bilirubin Total: 0.4 mg/dL (ref 0.0–1.2)
CO2: 19 mmol/L — ABNORMAL LOW (ref 20–29)
Calcium: 7.4 mg/dL — ABNORMAL LOW (ref 8.7–10.3)
Chloride: 106 mmol/L (ref 96–106)
Creatinine, Ser: 1.09 mg/dL — ABNORMAL HIGH (ref 0.57–1.00)
Globulin, Total: 3 g/dL (ref 1.5–4.5)
Glucose: 168 mg/dL — ABNORMAL HIGH (ref 70–99)
Potassium: 3.5 mmol/L (ref 3.5–5.2)
Sodium: 142 mmol/L (ref 134–144)
Total Protein: 7.4 g/dL (ref 6.0–8.5)
eGFR: 58 mL/min/{1.73_m2} — ABNORMAL LOW (ref >59)

## 2023-08-31 LAB — LIPID PANEL
Chol/HDL Ratio: 2.2 ratio (ref 0.0–4.4)
Cholesterol, Total: 103 mg/dL (ref 100–199)
HDL: 46 mg/dL (ref >39)
LDL Chol Calc (NIH): 39 mg/dL (ref 0–99)
Triglycerides: 93 mg/dL (ref 0–149)
VLDL Cholesterol Cal: 18 mg/dL (ref 5–40)

## 2023-08-31 LAB — TSH: TSH: 1.76 u[IU]/mL (ref 0.450–4.500)

## 2023-08-31 LAB — HEMOGLOBIN A1C
Est. average glucose Bld gHb Est-mCnc: 123 mg/dL
Hgb A1c MFr Bld: 5.9 % — ABNORMAL HIGH (ref 4.8–5.6)

## 2023-09-03 NOTE — Progress Notes (Signed)
 All labs are stable for you except Decreased kidney function.  Advised to drink 8-12 glasses of water every day, avoid NSAIDs, and have renal functions check avery 6-12 months to ensure stability.   Decrease calcium , we will recheck at follow-up Elevated A1c of 5.9.  Encouraged low-carb diet and regular exercise Please continue to take metformin .  We will reassess during your follow-up

## 2023-10-07 NOTE — Progress Notes (Deleted)
 Established patient visit  Patient: Rebecca Yang   DOB: 1965-12-01   58 y.o. Female  MRN: [id number] Visit Date: 10/08/2023  Today's healthcare provider: Jolynn Spencer, PA-C   No chief complaint on file.  Subjective       Discussed the use of AI scribe software for clinical note transcription with the patient, who gave verbal consent to proceed.  History of Present Illness   Neurology? Referral      08/30/2023    2:22 PM 07/17/2022    1:25 PM 06/13/2022    1:44 PM  Depression screen PHQ 2/9  Decreased Interest 0 0 0  Down, Depressed, Hopeless 3 1 1   PHQ - 2 Score 3 1 1   Altered sleeping 2 2 1   Tired, decreased energy 2 2 1   Change in appetite 0 0 1  Feeling bad or failure about yourself  1 0 0  Trouble concentrating 1 0 0  Moving slowly or fidgety/restless 0 0 0  Suicidal thoughts 1 0 0  PHQ-9 Score 10 5 4   Difficult doing work/chores Somewhat difficult Not difficult at all Not difficult at all      08/30/2023    2:23 PM  GAD 7 : Generalized Anxiety Score  Nervous, Anxious, on Edge 1  Control/stop worrying 1  Worry too much - different things 1  Trouble relaxing 1  Restless 0  Easily annoyed or irritable 0  Afraid - awful might happen 0  Total GAD 7 Score 4  Anxiety Difficulty Somewhat difficult    Medications: Outpatient Medications Prior to Visit  Medication Sig   acetaminophen  (TYLENOL ) 325 MG tablet Take 650 mg by mouth every 6 (six) hours as needed.   amLODipine  (NORVASC ) 10 MG tablet Take 1 tablet by mouth once daily (Patient not taking: Reported on 08/30/2023)   aspirin  EC 81 MG tablet Take 1 tablet (81 mg total) by mouth daily. Swallow whole.   atorvastatin  (LIPITOR) 80 MG tablet Take 1 tablet (80 mg total) by mouth daily.   losartan  (COZAAR ) 25 MG tablet Take 1 tablet (25 mg total) by mouth daily. (Patient not taking: Reported on 08/30/2023)   metFORMIN  (GLUCOPHAGE ) 500 MG tablet Take 1 tablet (500 mg total) by mouth daily with breakfast. (Patient  not taking: Reported on 08/30/2023)   nicotine  polacrilex (NICORETTE ) 2 MG gum Take 1 each (2 mg total) by mouth as needed for smoking cessation.   No facility-administered medications prior to visit.    Review of Systems  All other systems reviewed and are negative.  All negative Except see HPI   {Insert previous labs (optional):23779} {See past labs  Heme  Chem  Endocrine  Serology  Results Review (optional):1}   Objective    LMP 01/23/2016 (Approximate)  {Insert last BP/Wt (optional):23777}{See vitals history (optional):1}   Physical Exam Vitals reviewed.  Constitutional:      General: She is not in acute distress.    Appearance: Normal appearance. She is well-developed. She is not diaphoretic.  HENT:     Head: Normocephalic and atraumatic.  Eyes:     General: No scleral icterus.    Conjunctiva/sclera: Conjunctivae normal.  Neck:     Thyroid : No thyromegaly.  Cardiovascular:     Rate and Rhythm: Normal rate and regular rhythm.     Pulses: Normal pulses.     Heart sounds: Normal heart sounds. No murmur heard. Pulmonary:     Effort: Pulmonary effort is normal. No respiratory distress.     Breath sounds: Normal  breath sounds. No wheezing, rhonchi or rales.  Musculoskeletal:     Cervical back: Neck supple.     Right lower leg: No edema.     Left lower leg: No edema.  Lymphadenopathy:     Cervical: No cervical adenopathy.  Skin:    General: Skin is warm and dry.     Findings: No rash.  Neurological:     Mental Status: She is alert and oriented to person, place, and time. Mental status is at baseline.  Psychiatric:        Mood and Affect: Mood normal.        Behavior: Behavior normal.      No results found for any visits on 10/08/23.      Assessment and Plan Assessment & Plan     No orders of the defined types were placed in this encounter.   No follow-ups on file.   The patient was advised to call back or seek an in-person evaluation if the  symptoms worsen or if the condition fails to improve as anticipated.  I discussed the assessment and treatment plan with the patient. The patient was provided an opportunity to ask questions and all were answered. The patient agreed with the plan and demonstrated an understanding of the instructions.  I, Glendal Cassaday, PA-C have reviewed all documentation for this visit. The documentation on 10/08/2023  for the exam, diagnosis, procedures, and orders are all accurate and complete.  Jolynn Spencer, [facility], MMS [facility] [phone] (phone) [phone] (fax)  [facility]

## 2023-10-08 ENCOUNTER — Ambulatory Visit: Admitting: Physician Assistant

## 2023-10-08 DIAGNOSIS — Z8673 Personal history of transient ischemic attack (TIA), and cerebral infarction without residual deficits: Secondary | ICD-10-CM

## 2023-10-08 DIAGNOSIS — E785 Hyperlipidemia, unspecified: Secondary | ICD-10-CM

## 2023-10-08 DIAGNOSIS — Z720 Tobacco use: Secondary | ICD-10-CM

## 2023-10-08 DIAGNOSIS — F339 Major depressive disorder, recurrent, unspecified: Secondary | ICD-10-CM

## 2023-10-08 DIAGNOSIS — E119 Type 2 diabetes mellitus without complications: Secondary | ICD-10-CM

## 2023-10-08 DIAGNOSIS — I152 Hypertension secondary to endocrine disorders: Secondary | ICD-10-CM

## 2023-10-08 DIAGNOSIS — Z5941 Food insecurity: Secondary | ICD-10-CM

## 2023-10-08 DIAGNOSIS — Z716 Tobacco abuse counseling: Secondary | ICD-10-CM

## 2023-10-08 DIAGNOSIS — Z5986 Financial insecurity: Secondary | ICD-10-CM

## 2023-11-06 DIAGNOSIS — R4589 Other symptoms and signs involving emotional state: Secondary | ICD-10-CM | POA: Diagnosis not present

## 2023-11-06 DIAGNOSIS — Z5941 Food insecurity: Secondary | ICD-10-CM | POA: Diagnosis not present

## 2023-11-06 DIAGNOSIS — I639 Cerebral infarction, unspecified: Secondary | ICD-10-CM | POA: Diagnosis not present

## 2023-11-06 DIAGNOSIS — Z659 Problem related to unspecified psychosocial circumstances: Secondary | ICD-10-CM | POA: Diagnosis not present

## 2023-11-06 DIAGNOSIS — R4689 Other symptoms and signs involving appearance and behavior: Secondary | ICD-10-CM | POA: Diagnosis not present

## 2023-11-06 DIAGNOSIS — Z599 Problem related to housing and economic circumstances, unspecified: Secondary | ICD-10-CM | POA: Diagnosis not present

## 2023-11-06 DIAGNOSIS — R200 Anesthesia of skin: Secondary | ICD-10-CM | POA: Diagnosis not present

## 2023-11-06 DIAGNOSIS — R531 Weakness: Secondary | ICD-10-CM | POA: Diagnosis not present

## 2023-11-06 DIAGNOSIS — Z79899 Other long term (current) drug therapy: Secondary | ICD-10-CM | POA: Diagnosis not present

## 2023-11-06 DIAGNOSIS — Z5986 Financial insecurity: Secondary | ICD-10-CM | POA: Diagnosis not present

## 2023-11-06 DIAGNOSIS — Z1331 Encounter for screening for depression: Secondary | ICD-10-CM | POA: Diagnosis not present

## 2023-11-06 DIAGNOSIS — R202 Paresthesia of skin: Secondary | ICD-10-CM | POA: Diagnosis not present

## 2023-12-07 ENCOUNTER — Ambulatory Visit (INDEPENDENT_AMBULATORY_CARE_PROVIDER_SITE_OTHER): Admitting: Physician Assistant

## 2023-12-07 ENCOUNTER — Encounter: Payer: Self-pay | Admitting: Physician Assistant

## 2023-12-07 VITALS — BP 110/80 | HR 82 | Resp 14 | Ht 64.0 in | Wt 126.0 lb

## 2023-12-07 DIAGNOSIS — E119 Type 2 diabetes mellitus without complications: Secondary | ICD-10-CM | POA: Diagnosis not present

## 2023-12-07 DIAGNOSIS — I152 Hypertension secondary to endocrine disorders: Secondary | ICD-10-CM | POA: Diagnosis not present

## 2023-12-07 DIAGNOSIS — Z5986 Financial insecurity: Secondary | ICD-10-CM

## 2023-12-07 DIAGNOSIS — Z8673 Personal history of transient ischemic attack (TIA), and cerebral infarction without residual deficits: Secondary | ICD-10-CM

## 2023-12-07 DIAGNOSIS — E1159 Type 2 diabetes mellitus with other circulatory complications: Secondary | ICD-10-CM

## 2023-12-07 DIAGNOSIS — Z720 Tobacco use: Secondary | ICD-10-CM

## 2023-12-07 DIAGNOSIS — Z91148 Patient's other noncompliance with medication regimen for other reason: Secondary | ICD-10-CM

## 2023-12-07 DIAGNOSIS — F339 Major depressive disorder, recurrent, unspecified: Secondary | ICD-10-CM

## 2023-12-07 MED ORDER — NICOTINE POLACRILEX 2 MG MT GUM
2.0000 mg | CHEWING_GUM | OROMUCOSAL | 0 refills | Status: AC | PRN
  Filled 2024-01-09: qty 110, 25d supply, fill #0

## 2023-12-07 MED ORDER — ATORVASTATIN CALCIUM 80 MG PO TABS
80.0000 mg | ORAL_TABLET | Freq: Every day | ORAL | 0 refills | Status: AC
  Filled 2024-01-09 (×2): qty 90, 90d supply, fill #0

## 2023-12-07 NOTE — Progress Notes (Unsigned)
 Established patient visit  Patient: Rebecca Yang   DOB: 09-23-1965   58 y.o. Female  MRN: [id number] Visit Date: 12/07/2023  Today's healthcare provider: Jolynn Spencer, PA-C   Chief Complaint  Patient presents with   Annual Exam    Sleep pattern; 8 hours of sleep Exercise: one Concerns: assistance with walking; neurologist stated they would call her when walker is ready. No one has called. Saw neuro 11/06/23 order was placed. No eye exam this year   Subjective     HPI     Annual Exam    Additional comments: Sleep pattern; 8 hours of sleep Exercise: one Concerns: assistance with walking; neurologist stated they would call her when walker is ready. No one has called. Saw neuro 11/06/23 order was placed. No eye exam this year      Last edited by Wilfred Hargis RAMAN, CMA on 12/07/2023 11:00 AM.       Discussed the use of AI scribe software for clinical note transcription with the patient, who gave verbal consent to proceed.  History of Present Illness Rebecca Yang is a 58 year old female with a recent stroke who presents with worsening symptoms and medication management issues.  She experiences worsening numbness, weakness, and diplopia following her recent stroke. She is not on blood thinners or aspirin  due to financial constraints. Her diabetes is unmanaged as she was advised against medication for her glucose levels. She is also not taking antihypertensive medication due to lack of funds.  She lives with a friend in [place] but faces potential homelessness within a month. She is unemployed and not receiving disability benefits. She has received some assistance from [facility].  She denies smoking, alcohol, and recreational drug use. She experiences depression but is not on medication for it. She reports no significant anxiety and is not interested in receiving vaccines at this time.       08/30/2023    2:22 PM 07/17/2022    1:25 PM 06/13/2022    1:44 PM  Depression  screen PHQ 2/9  Decreased Interest 0 0 0  Down, Depressed, Hopeless 3 1 1   PHQ - 2 Score 3 1 1   Altered sleeping 2 2 1   Tired, decreased energy 2 2 1   Change in appetite 0 0 1  Feeling bad or failure about yourself  1 0 0  Trouble concentrating 1 0 0  Moving slowly or fidgety/restless 0 0 0  Suicidal thoughts 1 0 0  PHQ-9 Score 10 5 4   Difficult doing work/chores Somewhat difficult Not difficult at all Not difficult at all      08/30/2023    2:23 PM  GAD 7 : Generalized Anxiety Score  Nervous, Anxious, on Edge 1  Control/stop worrying 1  Worry too much - different things 1  Trouble relaxing 1  Restless 0  Easily annoyed or irritable 0  Afraid - awful might happen 0  Total GAD 7 Score 4  Anxiety Difficulty Somewhat difficult    Medications: Outpatient Medications Prior to Visit  Medication Sig   aspirin  EC 81 MG tablet Take 1 tablet (81 mg total) by mouth daily. Swallow whole.   amLODipine  (NORVASC ) 10 MG tablet Take 1 tablet by mouth once daily (Patient not taking: Reported on 08/30/2023)   nicotine  polacrilex (NICORETTE ) 2 MG gum Take 1 each (2 mg total) by mouth as needed for smoking cessation.   [DISCONTINUED] acetaminophen  (TYLENOL ) 325 MG tablet Take 650 mg by mouth every 6 (six) hours as  needed.   [DISCONTINUED] atorvastatin  (LIPITOR) 80 MG tablet Take 1 tablet (80 mg total) by mouth daily.   [DISCONTINUED] losartan  (COZAAR ) 25 MG tablet Take 1 tablet (25 mg total) by mouth daily. (Patient not taking: Reported on 08/30/2023)   [DISCONTINUED] metFORMIN  (GLUCOPHAGE ) 500 MG tablet Take 1 tablet (500 mg total) by mouth daily with breakfast. (Patient not taking: Reported on 08/30/2023)   No facility-administered medications prior to visit.    Review of Systems All negative Except see HPI   {Insert previous labs (optional):23779} {See past labs  Heme  Chem  Endocrine  Serology  Results Review (optional):1}   Objective    BP 110/80   Pulse 82   Resp 14   Ht 5' 4  (1.626 m)   Wt 126 lb (57.2 kg)   LMP 01/23/2016 (Approximate)   SpO2 98%   BMI 21.63 kg/m  {Insert last BP/Wt (optional):23777}{See vitals history (optional):1}   Physical Exam Vitals reviewed.  Constitutional:      General: She is not in acute distress.    Appearance: Normal appearance. She is well-developed. She is not diaphoretic.  HENT:     Head: Normocephalic and atraumatic.  Eyes:     General: No scleral icterus.    Conjunctiva/sclera: Conjunctivae normal.  Neck:     Thyroid : No thyromegaly.  Cardiovascular:     Rate and Rhythm: Normal rate and regular rhythm.     Pulses: Normal pulses.     Heart sounds: Normal heart sounds. No murmur heard. Pulmonary:     Effort: Pulmonary effort is normal. No respiratory distress.     Breath sounds: Normal breath sounds. No wheezing, rhonchi or rales.  Musculoskeletal:     Cervical back: Neck supple.     Right lower leg: No edema.     Left lower leg: No edema.  Lymphadenopathy:     Cervical: No cervical adenopathy.  Skin:    General: Skin is warm and dry.     Findings: No rash.  Neurological:     Mental Status: She is alert and oriented to person, place, and time. Mental status is at baseline.  Psychiatric:        Mood and Affect: Mood normal.        Behavior: Behavior normal.      No results found for any visits on 12/07/23.      Assessment & Plan Recent stroke with worsening symptoms Symptoms worsening, not on aspirin  due to financial constraints. Neurology follow-up confirmed symptom progression. - Refer to clinic pharmacist for medication access. However, questioning if it will approve pt's compliance. Pt is strongly advised on compliance with her current medications - Coordinate with social worker for support services. - Order blood work to assess health status. Questioning compliance with labwork Pt was sent to [facility] for assistance but she is currently unclear if she received any assistance or was  referred. Was referred to neurosurgeon and to PT for management. Pt is noncompliant. Will follow-up   Type 2 diabetes mellitus, not on medication Chronic Diabetes unmanaged due to financial constraints. Medication necessity unclear. - Refer to clinic pharmacist for medication access. - Order blood work to assess health status. Advised on low carb diet and regular exercise Will follow-up  Hypertension, not on medication Chronic and stable Hypertension unmanaged due to financial constraints. Blood pressure normal today, medication need questioned. - Refer to clinic pharmacist for medication access. - Order blood work to assess health status. Will follow-up  Depression Chronic  08/30/2023    2:22 PM 07/17/2022    1:25 PM 06/13/2022    1:44 PM  PHQ9 SCORE ONLY  PHQ-9 Total Score 10  5  4       Data saved with a previous flowsheet row definition      08/30/2023    2:23 PM  GAD 7 : Generalized Anxiety Score  Nervous, Anxious, on Edge 1  Control/stop worrying 1  Worry too much - different things 1  Trouble relaxing 1  Restless 0  Easily annoyed or irritable 0  Afraid - awful might happen 0  Total GAD 7 Score 4  Anxiety Difficulty Somewhat difficult    Mild depression without medication or treatment plan. - Coordinate with Child psychotherapist for support services. Pt requested refills but refused to purchase medication due to financial constrains Pt displayed disruptive behavior during the visit: Pt got very irritated with a request of stopping playing game during the visit and started curse at me.   Getting cursed at after asking someone to stop playing a video game can be shocking and hurtful,  Tobacco use Not smoking, requires nicotine  gum for cessation support. Will monitor  Hypertension associated with diabetes (HCC)  - atorvastatin  (LIPITOR) 80 MG tablet; Take 1 tablet (80 mg total) by mouth daily.  Dispense: 90 tablet; Refill: 0 - CBC with Differential/Platelet -  Comprehensive metabolic panel with GFR - Hemoglobin A1c - Lipid Panel With LDL/HDL Ratio - Hepatitis C antibody - AMB Referral VBCI Care Management  Type 2 diabetes mellitus without complication, without long-term current use of insulin (HCC)  - CBC with Differential/Platelet - Comprehensive metabolic panel with GFR - Hemoglobin A1c - Lipid Panel With LDL/HDL Ratio - Hepatitis C antibody - AMB Referral VBCI Care Management  Nicotine  use  - nicotine  polacrilex (NICORETTE ) 2 MG gum; Take 1 each (2 mg total) by mouth as needed for smoking cessation.  Dispense: 100 tablet; Refill: 0  Financially insecure (Primary)   Noncompliance w/medication treatment due to intermit use of medication   No orders of the defined types were placed in this encounter.   No follow-ups on file.   The patient was advised to call back or seek an in-person evaluation if the symptoms worsen or if the condition fails to improve as anticipated.  I discussed the assessment and treatment plan with the patient. The patient was provided an opportunity to ask questions and all were answered. The patient agreed with the plan and demonstrated an understanding of the instructions.  I, Sahana Boyland, PA-C have reviewed all documentation for this visit. The documentation on 12/07/2023  for the exam, diagnosis, procedures, and orders are all accurate and complete.  Jolynn Spencer, [facility], MMS [facility] [phone] (phone) [phone] (fax)  [facility]

## 2023-12-08 LAB — CBC WITH DIFFERENTIAL/PLATELET
Basophils Absolute: 0 x10E3/uL (ref 0.0–0.2)
Basos: 1 %
EOS (ABSOLUTE): 0.1 x10E3/uL (ref 0.0–0.4)
Eos: 2 %
Hematocrit: 37.4 % (ref 34.0–46.6)
Hemoglobin: 12.3 g/dL (ref 11.1–15.9)
Immature Grans (Abs): 0 x10E3/uL (ref 0.0–0.1)
Immature Granulocytes: 0 %
Lymphocytes Absolute: 2.2 x10E3/uL (ref 0.7–3.1)
Lymphs: 46 %
MCH: 31.4 pg (ref 26.6–33.0)
MCHC: 32.9 g/dL (ref 31.5–35.7)
MCV: 95 fL (ref 79–97)
Monocytes Absolute: 0.5 x10E3/uL (ref 0.1–0.9)
Monocytes: 10 %
Neutrophils Absolute: 1.9 x10E3/uL (ref 1.4–7.0)
Neutrophils: 41 %
Platelets: 204 x10E3/uL (ref 150–450)
RBC: 3.92 x10E6/uL (ref 3.77–5.28)
RDW: 13.2 % (ref 11.7–15.4)
WBC: 4.7 x10E3/uL (ref 3.4–10.8)

## 2023-12-08 LAB — COMPREHENSIVE METABOLIC PANEL WITH GFR
ALT: 8 IU/L (ref 0–32)
AST: 13 IU/L (ref 0–40)
Albumin: 4.1 g/dL (ref 3.8–4.9)
Alkaline Phosphatase: 75 IU/L (ref 49–135)
BUN/Creatinine Ratio: 9 — ABNORMAL LOW (ref 12–28)
BUN: 11 mg/dL (ref 8–27)
Bilirubin Total: 0.5 mg/dL (ref 0.0–1.2)
CO2: 17 mmol/L — ABNORMAL LOW (ref 20–29)
Calcium: 9.3 mg/dL (ref 8.7–10.3)
Chloride: 104 mmol/L (ref 96–106)
Creatinine, Ser: 1.17 mg/dL — ABNORMAL HIGH (ref 0.57–1.00)
Globulin, Total: 3.5 g/dL (ref 1.5–4.5)
Glucose: 120 mg/dL — ABNORMAL HIGH (ref 70–99)
Potassium: 3.7 mmol/L (ref 3.5–5.2)
Sodium: 142 mmol/L (ref 134–144)
Total Protein: 7.6 g/dL (ref 6.0–8.5)
eGFR: 53 mL/min/1.73 — ABNORMAL LOW (ref >59)

## 2023-12-08 LAB — LIPID PANEL WITH LDL/HDL RATIO
Cholesterol, Total: 212 mg/dL — ABNORMAL HIGH (ref 100–199)
HDL: 49 mg/dL (ref >39)
LDL Chol Calc (NIH): 138 mg/dL — ABNORMAL HIGH (ref 0–99)
LDL/HDL Ratio: 2.8 ratio (ref 0.0–3.2)
Triglycerides: 139 mg/dL (ref 0–149)
VLDL Cholesterol Cal: 25 mg/dL (ref 5–40)

## 2023-12-08 LAB — HEPATITIS C ANTIBODY: Hep C Virus Ab: NONREACTIVE

## 2023-12-08 LAB — HEMOGLOBIN A1C
Est. average glucose Bld gHb Est-mCnc: 114 mg/dL
Hgb A1c MFr Bld: 5.6 % (ref 4.8–5.6)

## 2023-12-09 DIAGNOSIS — Z91148 Patient's other noncompliance with medication regimen for other reason: Secondary | ICD-10-CM | POA: Insufficient documentation

## 2023-12-10 ENCOUNTER — Ambulatory Visit: Payer: Self-pay | Admitting: Physician Assistant

## 2023-12-10 ENCOUNTER — Other Ambulatory Visit: Payer: Self-pay | Admitting: Physician Assistant

## 2023-12-10 DIAGNOSIS — E1159 Type 2 diabetes mellitus with other circulatory complications: Secondary | ICD-10-CM

## 2023-12-10 MED ORDER — BLOOD PRESSURE KIT DEVI
0 refills | Status: AC
  Filled 2024-01-09: qty 1, 30d supply, fill #0

## 2023-12-10 NOTE — Progress Notes (Signed)
 8 to 12 glasses of water/clarification

## 2023-12-10 NOTE — Progress Notes (Signed)
 All labs are stable for you except -Decreased kidney function with GFR of 53.  Advised to drink plenty of water up to 8/12 glasses of water, avoid NSAIDs/ibuprofen , Advil  -Elevated cholesterol.  Advise low-cholesterol diet and regular exercise, and repeat restart cholesterol medication/atorvastatin  - Advised to measure your blood pressure at home daily.  If Blood pressure will increase may need to restart your blood pressure medication.  Blood pressure device will be sent to your pharmacy

## 2023-12-11 ENCOUNTER — Telehealth: Payer: Self-pay

## 2023-12-11 NOTE — Progress Notes (Signed)
 Complex Care Management Note  Care Guide Note 12/11/2023 Name: Rebecca Yang MRN: [id number] DOB: 1965/03/18  Rebecca Yang is a 58 y.o. year old female who sees Chester, Janna, PA-C for primary care. I reached out to Ber[place] JONETTA Barrio by phone today to offer complex care management services.  [name] was given information about Complex Care Management services today including:   The Complex Care Management services include support from the care team which includes your Nurse Care Manager, Clinical Social Worker, or Pharmacist.  The Complex Care Management team is here to help remove barriers to the health concerns and goals most important to you. Complex Care Management services are voluntary, and the patient may decline or stop services at any time by request to their care team member.   Complex Care Management Consent Status: Patient agreed to services and verbal consent obtained.   Follow up plan:  Telephone appointment with complex care management team member scheduled for:  12/28/2023 @ 3 pm  Encounter Outcome:  Patient Scheduled   Leotis Rase [facility], [facility] Guide  Direct Dial: [phone]  Fax [phone]

## 2023-12-12 NOTE — Care Plan (Signed)
  Problem: Care Coordination Goal: Seek help from health care provider/other resources as appropriate [facility]) Description: Patient's SmartGoal:  Patient will receive resources to help with moving expenses, by the expected end date Outcome: Not Met/ Continue Goal Note: Goal initiated. Intervention: Connect with support groups or resources as needed Description: Patient will likely be moving to [place] to live with family.  She does not have any income at this time as she's awaiting SS disability payout.  She is in need of financial assistance with moving costs for a few bags of belongings and for herself.  Please assist with any resources that may be available.  Thanks, Bari Fair, LCSW [facility] Care Manager Note: DW Care Coordinator tasked to provide moving resources   Problem: The 5 Core HRSN/SDOH domains Goal: Food insecurity (DWSDOH) - seek recommended services to reduce SDOH barriers Description: Patient's SmartGoal:  Patient will receive and review food pantry resources to help reduce food insecurity, by the expected end date Outcome: Met/ Completed Flowsheets (Taken 12/12/2023 1550) Was a Food connection made?: Yes- Resources sent to patient Was the Food connection resolved?: Y Note: Patient confirms receipt of the food pantry information.  Goal: Housing [facility]) - seek recommended services to reduce SDOH barriers Description: Patient's SmartGoal:  Patient will receive and review housing resources to help patient access shelter and permanent housing, by the expected end date Outcome: Met/ Completed Flowsheets (Taken 12/12/2023 1550) Was a Housing/Shelter connection made?: Yes- Resources sent to patient Was the Housing/Shelter connection resolved?: Y Note: Patient confirms receipt of housing/shelter resources.

## 2023-12-12 NOTE — Progress Notes (Signed)
 [facility] Rising Risk  Care Management -  Follow-Up Engagement  Purpose: A [facility] completed a phone call to address follow-up care needs.  Rebecca Yang is being managed for the following: care coordination. Patient Reports: The patient states her roommate will likely be moving around the 5th of this month.  Patient reports she will likely have to move to [place] to live with family.  She confirms she has spoken with her mom and sister and they have said she can live with them.  Patient is unsure how she will get her belongings (a few bags) and herself to [place].  CM tasked DW Care Coordinator for help with any available resources for moving costs. CM let patient know [employer] Disability has been trying to reach her to assist with applying for disability.  Patient reports she's already applied AND been approved for disability.  CM was unaware patient had already applied.  Patient reports she's working with [employer].  Patient received a letter from [employer] stating she owes them $90 for medical records and patient understood the letter to say her SS would not be paid out until she pays the $90.  CM and patient called [employer] ([phone] ext. [phone]) and spoke with Bernardino.  He confirms the $90 patient owes will NOT hold up the [facility] payout.  He does not know when payment will be released to payment.  Bernardino is aware patient does not have any money at this time and he said she can pay once she receives her SSA income. Assessment and Intervention of Needs: [facility] Assessment completed.   Plan: Care Plan Discussed - Yes - Rebecca Yang has agreed to participate with the identified care plan that was discussed on 12/12/2023. Please see Plan of Care in the Notes section of Chart Review  Next follow up appointment date: Wednesday, January 02, 2024 at 3:00 PM [employer]    [address], [address]; [place], [place] [address] l  [email or web address] l [phone]   For more  information on [facility] services, click here.

## 2023-12-13 NOTE — Care Plan (Signed)
  Problem: Care Coordination Goal: Seek help from health care provider/other resources as appropriate [facility]) Description: Patient's SmartGoal:  Patient will receive resources to help with moving expenses, by the expected end date Intervention: Connect with support groups or resources as needed Description: Patient will likely be moving to [place] to live with family.  She does not have any income at this time as she's awaiting SS disability payout.  She is in need of financial assistance with moving costs for a few bags of belongings and for herself.  Please assist with any resources that may be available.  Thanks, Bari Fair, LCSW [facility] Care Manager Note: CC will send pt community resources via email - Tawni

## 2023-12-18 NOTE — Progress Notes (Signed)
 Patient called due to missed call. She was transferred to [facility].

## 2023-12-28 ENCOUNTER — Other Ambulatory Visit: Payer: Self-pay | Admitting: *Deleted

## 2023-12-28 ENCOUNTER — Other Ambulatory Visit: Payer: Self-pay

## 2023-12-28 ENCOUNTER — Telehealth: Payer: Self-pay

## 2023-12-28 NOTE — Patient Instructions (Signed)
 Visit Information  Rebecca Yang was given information about Medicaid Managed Care team care coordination services as a part of their [facility] Community Plan Medicaid benefit.   If you would like to schedule transportation through your [facility], please call the following number at least 2 days in advance of your appointment: [phone]   Rides for urgent appointments can also be made after hours by calling Member Services.  Call the Behavioral Health Crisis Line at [phone], at any time, 24 hours a day, 7 days a week. If you are in danger or need immediate medical attention call 911.  Please see education materials related to *** provided {MMEDCHOICES:24216}  {CCM PT INSTRUCTIONS:22237}  {MM FOLLOW UP EOJW:75841}  SIG***  Following is a copy of your plan of care:  There are no care plans that you recently modified to display for this patient.

## 2023-12-31 ENCOUNTER — Other Ambulatory Visit: Payer: Self-pay

## 2023-12-31 NOTE — Patient Outreach (Addendum)
 Complex Care Management   Visit Note  12/31/2023  Name:  Rebecca Yang MRN: [id number] DOB: November 12, 1965  Situation: Referral received for Complex Care Management related to financial strain-ability to afford medications I obtained verbal consent from Patient.  Visit completed with Patient  on the phone on 12/28/23.  Background:   Past Medical History:  Diagnosis Date   Hyperlipidemia    Hypertension    Neuropathy    Sarcoidosis    Stroke [facility])     Assessment: Patient Reported Symptoms:  Cognitive Cognitive Status: Alert and oriented to person, place, and time, Able to follow simple commands Cognitive/Intellectual Conditions Management [RPT]: Other Other: history of stroke      Neurological Neurological Review of Symptoms: No symptoms reported    HEENT HEENT Symptoms Reported: Other: (eyesight issues-hard to focus)      Cardiovascular Cardiovascular Symptoms Reported: No symptoms reported    Respiratory Respiratory Symptoms Reported: No symptoms reported    Endocrine Endocrine Symptoms Reported: No symptoms reported Is patient diabetic?: No    Gastrointestinal Gastrointestinal Symptoms Reported: No symptoms reported      Genitourinary Genitourinary Symptoms Reported: No symptoms reported    Integumentary Integumentary Symptoms Reported: No symptoms reported    Musculoskeletal Musculoskelatal Symptoms Reviewed: Difficulty walking, Joint pain, Limited mobility Additional Musculoskeletal Details: pain on left side-recommended tylenol  Musculoskeletal Management Strategies: Medication therapy      Psychosocial Psychosocial Symptoms Reported: No symptoms reported     Quality of Family Relationships: supportive Do you feel physically threatened by others?: No    12/31/2023    PHQ2-9 Depression Screening   Little interest or pleasure in doing things Not at all  Feeling down, depressed, or hopeless Not at all  PHQ-2 - Total Score 0  Trouble falling or staying  asleep, or sleeping too much    Feeling tired or having little energy    Poor appetite or overeating     Feeling bad about yourself - or that you are a failure or have let yourself or your family down    Trouble concentrating on things, such as reading the newspaper or watching television    Moving or speaking so slowly that other people could have noticed.  Or the opposite - being so fidgety or restless that you have been moving around a lot more than usual    Thoughts that you would be better off dead, or hurting yourself in some way    PHQ2-9 Total Score    If you checked off any problems, how difficult have these problems made it for you to do your work, take care of things at home, or get along with other people    Depression Interventions/Treatment      There were no vitals filed for this visit.  Medications Reviewed Today     Reviewed by Ermalinda Lenn HERO, LCSW (Social Worker) on 12/28/23 at 1515  Med List Status: <None>   Medication Order Taking? Sig Documenting Provider Last Dose Status Informant  amLODipine  (NORVASC ) 10 MG tablet [id number]  Take 1 tablet by mouth once daily  Patient not taking: Reported on 12/28/2023   Ostwalt, Janna, PA-C  Active Pharmacy Records, Other  aspirin  EC 81 MG tablet [id number] Yes Take 1 tablet (81 mg total) by mouth daily. Swallow whole. Dezii, Alexandra, DO  Active   atorvastatin  (LIPITOR) 80 MG tablet [id number] Yes Take 1 tablet (80 mg total) by mouth daily. Ostwalt, Janna, PA-C  Active   Blood Pressure Monitoring (BLOOD PRESSURE KIT) DEVI  [id number]  Measure blood pressure at home daily  Patient not taking: Reported on 12/28/2023   Ostwalt, Janna, PA-C  Active   nicotine  polacrilex (NICORETTE ) 2 MG gum [id number] Yes Take 1 each (2 mg total) by mouth as needed for smoking cessation. Ostwalt, Janna, PA-C  Active             Recommendation:   PCP Follow-up [facility]  Follow Up Plan:   Telephone follow up appointment  date/time:  01/08/24 10:30am  Callum Wolf Ermalinda HUGHS [place]  [facility], [facility] Licensed Clinical Social Worker  Direct Dial: [phone]

## 2024-01-08 ENCOUNTER — Other Ambulatory Visit: Payer: Self-pay

## 2024-01-08 ENCOUNTER — Other Ambulatory Visit: Payer: Self-pay | Admitting: *Deleted

## 2024-01-08 MED ORDER — ASPIRIN 81 MG PO TBEC
81.0000 mg | DELAYED_RELEASE_TABLET | Freq: Every day | ORAL | 3 refills | Status: AC
  Filled 2024-01-09: qty 90, 90d supply, fill #0

## 2024-01-08 NOTE — Patient Instructions (Signed)
 Visit Information  Thank you for taking time to visit with me today. Please don't hesitate to contact me if I can be of assistance to you before our next scheduled appointment. 01/09/24 Your next care management appointment is by telephone on 12/2923 at 2:30am    Please call the care guide team at [phone] if you need to cancel, schedule, or reschedule an appointment.   Please call the Suicide and Crisis Lifeline: 988 call the [place]  National Suicide Prevention Lifeline: 657 093 9915 or TTY: 8602948096 TTY (906)293-9463) to talk to a trained counselor call 1-800-273-TALK (toll free, 24 hour hotline) call 911 if you are experiencing a Mental Health or Behavioral Health Crisis or need someone to talk t   Ramaya Guile, LCSW [place]  [facility], [facility] Licensed Clinical Social Worker  Direct Dial: [phone]

## 2024-01-08 NOTE — Patient Outreach (Signed)
 Complex Care Management   Visit Note  01/08/2024  Name:  Rebecca Yang MRN: [id number] DOB: June 14, 1965  Situation: Referral received for Complex Care Management related to inability to afford medications I obtained verbal consent from Patient.  Visit completed with Patient  on the phone  Background:   Past Medical History:  Diagnosis Date   Hyperlipidemia    Hypertension    Neuropathy    Sarcoidosis    Stroke [facility])     Assessment: Patient Reported Symptoms:  Cognitive Cognitive Status: Alert and oriented to person, place, and time, Able to follow simple commands Cognitive/Intellectual Conditions Management [RPT]: Other Other: history of stroke      Neurological Neurological Review of Symptoms: No symptoms reported    HEENT HEENT Symptoms Reported: Change or loss of hearing      Cardiovascular Cardiovascular Symptoms Reported: No symptoms reported    Respiratory Respiratory Symptoms Reported: No symptoms reported    Endocrine Endocrine Symptoms Reported: No symptoms reported Is patient diabetic?: No    Gastrointestinal Gastrointestinal Symptoms Reported: No symptoms reported      Genitourinary Genitourinary Symptoms Reported: No symptoms reported    Integumentary Integumentary Symptoms Reported: No symptoms reported    Musculoskeletal Musculoskelatal Symptoms Reviewed: Difficulty walking, Joint pain, Limited mobility Additional Musculoskeletal Details: pain on left side continues-continues to take tylenol  Musculoskeletal Management Strategies: Medication therapy      Psychosocial Psychosocial Symptoms Reported: No symptoms reported          01/08/2024    PHQ2-9 Depression Screening   Little interest or pleasure in doing things    Feeling down, depressed, or hopeless    PHQ-2 - Total Score    Trouble falling or staying asleep, or sleeping too much    Feeling tired or having little energy    Poor appetite or overeating     Feeling bad about yourself -  or that you are a failure or have let yourself or your family down    Trouble concentrating on things, such as reading the newspaper or watching television    Moving or speaking so slowly that other people could have noticed.  Or the opposite - being so fidgety or restless that you have been moving around a lot more than usual    Thoughts that you would be better off dead, or hurting yourself in some way    PHQ2-9 Total Score    If you checked off any problems, how difficult have these problems made it for you to do your work, take care of things at home, or get along with other people    Depression Interventions/Treatment      There were no vitals filed for this visit.  Medications Reviewed Today     Reviewed by Ermalinda Lenn HERO, LCSW (Social Worker) on 01/08/24 at 1103  Med List Status: <None>   Medication Order Taking? Sig Documenting Provider Last Dose Status Informant  amLODipine  (NORVASC ) 10 MG tablet [id number]  Take 1 tablet by mouth once daily  Patient not taking: Reported on 01/08/2024   Ostwalt, Janna, PA-C  Active Pharmacy Records, Other  aspirin  EC 81 MG tablet [id number]  Take 1 tablet (81 mg total) by mouth daily. Swallow whole.  Patient not taking: Reported on 01/08/2024   Dezii, Alexandra, DO  Active   atorvastatin  (LIPITOR) 80 MG tablet [id number]  Take 1 tablet (80 mg total) by mouth daily.  Patient not taking: Reported on 01/08/2024   Ostwalt, Janna, PA-C  Active   Blood  Pressure Monitoring (BLOOD PRESSURE KIT) DEVI [id number]  Measure blood pressure at home daily  Patient not taking: Reported on 12/28/2023   Ostwalt, Janna, PA-C  Active   nicotine  polacrilex (NICORETTE ) 2 MG gum [id number]  Take 1 each (2 mg total) by mouth as needed for smoking cessation.  Patient not taking: Reported on 01/08/2024   Ostwalt, Janna, PA-C  Active             Recommendation:   PCP Follow-up Continue to work with [facility] to obtain medications  Follow Up  Plan:   Telephone follow up appointment date/time:  01/09/24   Lenn Mean, LCSW [place]  [facility], [facility] Licensed Clinical Social Worker  Direct Dial: [phone]

## 2024-01-09 ENCOUNTER — Other Ambulatory Visit: Payer: Self-pay

## 2024-01-09 ENCOUNTER — Other Ambulatory Visit: Payer: Self-pay | Admitting: *Deleted

## 2024-01-09 MED FILL — Amlodipine Besylate Tab 10 MG (Base Equivalent): ORAL | 90 days supply | Qty: 90 | Fill #0 | Status: CN

## 2024-01-09 NOTE — Patient Outreach (Signed)
 Phone call to patient to confirm that her prescriptions have been transferred to the [facility] community pharmacy [address] Patient agreeable to picking the medicines up early next week. CSW will call to follow up on 01/24/24 at 10:30am.    Lenn Mean, LCSW [place]  [facility], [facility] Licensed Clinical Social Worker  Direct Dial: [phone]

## 2024-01-19 ENCOUNTER — Other Ambulatory Visit: Payer: Self-pay

## 2024-01-24 ENCOUNTER — Telehealth: Payer: Self-pay | Admitting: *Deleted

## 2024-01-24 ENCOUNTER — Encounter: Payer: Self-pay | Admitting: *Deleted

## 2024-01-24 NOTE — Patient Instructions (Signed)
 Berwyn JONETTA Barrio - I am sorry I was unable to reach you today for our scheduled appointment. I work with Ostwalt, Janna, PA-C and am calling to support your healthcare needs. Please contact me at [phone] at your earliest convenience. I look forward to speaking with you soon.   Thank you,     Kahmari Koller, LCSW [place]  [facility], [facility] Licensed Clinical Social Worker  Direct Dial: [phone]

## 2024-03-18 ENCOUNTER — Encounter: Payer: Self-pay | Admitting: *Deleted

## 2024-03-18 NOTE — Patient Instructions (Addendum)
 Berwyn JONETTA Barrio - I am sorry I was unable to reach you today. I work with Ostwalt, Janna, PA-C and am calling to support your healthcare needs. Please contact me at [phone] at your earliest convenience. I look forward to speaking with you soon.   Thank you,    Corgan Mormile, LCSW [place]  [facility], [facility] Licensed Clinical Social Worker  Direct Dial: [phone]

## 2024-03-28 ENCOUNTER — Encounter: Payer: Self-pay | Admitting: *Deleted

## 2024-03-28 ENCOUNTER — Telehealth: Payer: Self-pay | Admitting: *Deleted

## 2024-03-28 NOTE — Patient Instructions (Signed)
 Berwyn JONETTA Barrio - I have attempted to call you three times but have been unsuccessful in reaching you. I work with Ostwalt, Janna, PA-C and am calling to support your healthcare needs. If I can be of assistance to you, please contact me at [phone].     Thank you,    Hisayo Delossantos, LCSW   [facility], [facility] Licensed Clinical Social Worker  Direct Dial: [phone]
